# Patient Record
Sex: Female | Born: 1975
Health system: Southern US, Community
[De-identification: ages and names within clinical notes are randomized; demographics above are authoritative.]

## PROBLEM LIST (undated history)

## (undated) ENCOUNTER — Ambulatory Visit: Admission: EM | Source: Home / Self Care

## (undated) DIAGNOSIS — F419 Anxiety disorder, unspecified: Secondary | ICD-10-CM

## (undated) DIAGNOSIS — J45909 Unspecified asthma, uncomplicated: Secondary | ICD-10-CM

## (undated) DIAGNOSIS — D649 Anemia, unspecified: Secondary | ICD-10-CM

## (undated) HISTORY — DX: Anxiety disorder, unspecified: F41.9

## (undated) HISTORY — DX: Anemia, unspecified: D64.9

---

## 1998-08-26 ENCOUNTER — Emergency Department (HOSPITAL_COMMUNITY): Admission: EM | Admit: 1998-08-26 | Discharge: 1998-08-26 | Payer: Self-pay | Admitting: Family Medicine

## 1999-01-15 ENCOUNTER — Emergency Department (HOSPITAL_COMMUNITY): Admission: EM | Admit: 1999-01-15 | Discharge: 1999-01-15 | Payer: Self-pay | Admitting: *Deleted

## 2000-05-04 ENCOUNTER — Other Ambulatory Visit: Admission: RE | Admit: 2000-05-04 | Discharge: 2000-05-04 | Payer: Self-pay | Admitting: Obstetrics and Gynecology

## 2001-04-24 ENCOUNTER — Other Ambulatory Visit: Admission: RE | Admit: 2001-04-24 | Discharge: 2001-04-24 | Payer: Self-pay | Admitting: Obstetrics and Gynecology

## 2001-06-16 ENCOUNTER — Other Ambulatory Visit: Admission: RE | Admit: 2001-06-16 | Discharge: 2001-06-16 | Payer: Self-pay | Admitting: Obstetrics and Gynecology

## 2001-12-20 ENCOUNTER — Other Ambulatory Visit: Admission: RE | Admit: 2001-12-20 | Discharge: 2001-12-20 | Payer: Self-pay | Admitting: Obstetrics and Gynecology

## 2002-04-25 ENCOUNTER — Other Ambulatory Visit: Admission: RE | Admit: 2002-04-25 | Discharge: 2002-04-25 | Payer: Self-pay | Admitting: Obstetrics and Gynecology

## 2002-12-11 ENCOUNTER — Inpatient Hospital Stay (HOSPITAL_COMMUNITY): Admission: AD | Admit: 2002-12-11 | Discharge: 2002-12-14 | Payer: Self-pay | Admitting: Obstetrics and Gynecology

## 2004-07-30 ENCOUNTER — Other Ambulatory Visit: Admission: RE | Admit: 2004-07-30 | Discharge: 2004-07-30 | Payer: Self-pay | Admitting: Obstetrics and Gynecology

## 2004-09-06 ENCOUNTER — Emergency Department: Payer: Self-pay | Admitting: Emergency Medicine

## 2005-11-22 HISTORY — PX: COLONOSCOPY: SHX174

## 2005-11-23 LAB — HM COLONOSCOPY

## 2006-01-07 ENCOUNTER — Other Ambulatory Visit: Admission: RE | Admit: 2006-01-07 | Discharge: 2006-01-07 | Payer: Self-pay | Admitting: Obstetrics and Gynecology

## 2006-08-12 ENCOUNTER — Inpatient Hospital Stay (HOSPITAL_COMMUNITY): Admission: RE | Admit: 2006-08-12 | Discharge: 2006-08-15 | Payer: Self-pay | Admitting: Obstetrics and Gynecology

## 2006-08-30 ENCOUNTER — Ambulatory Visit (HOSPITAL_COMMUNITY): Admission: RE | Admit: 2006-08-30 | Discharge: 2006-08-30 | Payer: Self-pay | Admitting: Obstetrics and Gynecology

## 2007-08-17 ENCOUNTER — Ambulatory Visit: Payer: Self-pay | Admitting: Internal Medicine

## 2009-04-28 ENCOUNTER — Ambulatory Visit: Payer: Self-pay | Admitting: General Practice

## 2009-08-01 ENCOUNTER — Ambulatory Visit: Payer: Self-pay | Admitting: General Practice

## 2010-03-31 ENCOUNTER — Encounter: Payer: Self-pay | Admitting: Physician Assistant

## 2011-06-23 ENCOUNTER — Ambulatory Visit: Payer: Self-pay | Admitting: Family Medicine

## 2011-09-03 ENCOUNTER — Ambulatory Visit: Payer: Self-pay | Admitting: Internal Medicine

## 2011-12-03 ENCOUNTER — Ambulatory Visit: Payer: Self-pay

## 2012-05-05 ENCOUNTER — Other Ambulatory Visit: Payer: Self-pay | Admitting: Obstetrics and Gynecology

## 2012-08-20 ENCOUNTER — Ambulatory Visit: Payer: Self-pay | Admitting: Physician Assistant

## 2013-05-14 ENCOUNTER — Encounter: Payer: Self-pay | Admitting: Obstetrics and Gynecology

## 2013-05-14 ENCOUNTER — Ambulatory Visit (INDEPENDENT_AMBULATORY_CARE_PROVIDER_SITE_OTHER): Payer: 59 | Admitting: Obstetrics and Gynecology

## 2013-05-14 VITALS — BP 126/60 | HR 70 | Ht 67.0 in | Wt 255.0 lb

## 2013-05-14 DIAGNOSIS — Z01419 Encounter for gynecological examination (general) (routine) without abnormal findings: Secondary | ICD-10-CM

## 2013-05-14 DIAGNOSIS — R21 Rash and other nonspecific skin eruption: Secondary | ICD-10-CM

## 2013-05-14 DIAGNOSIS — Z0000 Encounter for general adult medical examination without abnormal findings: Secondary | ICD-10-CM

## 2013-05-14 LAB — POCT URINALYSIS DIPSTICK
Blood, UA: NEGATIVE
Glucose, UA: NEGATIVE
Ketones, UA: NEGATIVE
Protein, UA: NEGATIVE

## 2013-05-14 MED ORDER — TRIAMCINOLONE ACETONIDE 0.025 % EX CREA: TOPICAL_CREAM | Freq: Two times a day (BID) | CUTANEOUS | Status: DC

## 2013-05-14 NOTE — Progress Notes (Signed)
Patient ID: Katherine Adkins, female   DOB: [birth year], 37 y.o.   MRN: [id number] 37 y.o.   Married    [place]   female   [phone]   here for annual exam.   Right buttock itching for 1 year.  There all the time.  No lesions, drainage, or bleeding. No steroid cream use.   No menstrual problems. Trying to loose weight. 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   Some fatigue.  Having TFTs through PCP.  Patient's last menstrual period was 04/26/2013.          Sexually active: yes  The current method of family planning is vasectomy.    Exercising: walking Last mammogram:  never Last pap smear:18 months ago:wnl History of abnormal pap: Yes in 2003:colposcopy with no treatment--paps reverted to normal. Smoking: no Alcohol: 1 glass of wine per month. Last colonoscopy:2010 polyps with physician in [place].  Due for repeat colonoscopy. Last Bone Density:  never Last tetanus shot: unsure, but does through work at the [employer]. Last cholesterol check: 04/2013 Just saw PCP last week.  Hgb: PCP             Urine: neg   Family History  Problem Relation Age of Onset  . Adopted: Yes    There are no active problems to display for this patient.   Past Medical History  Diagnosis Date  . Anemia     Past Surgical History  Procedure Laterality Date  . Cesarean section  2004, 2007    Allergies: Review of patient's allergies indicates no known allergies.  Current Outpatient Prescriptions  Medication Sig Dispense Refill  . VENTOLIN HFA 108 (90 BASE) MCG/ACT inhaler Inhale 1 puff into the lungs as needed.      . zolpidem (AMBIEN) 10 MG tablet Take 1 tablet by mouth as needed.       No current facility-administered medications for this visit.    ROS: Pertinent items are noted in HPI.  Social Hx:  Married.  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  Exam:    BP 126/60  Pulse 70  Ht 5\' 7"  (1.702 m)  Wt 255 lb (115.667 kg)  BMI 39.93 kg/m2  LMP  04/26/2013   Wt Readings from Last 3 Encounters:  05/14/13 255 lb (115.667 kg)     Ht Readings from Last 3 Encounters:  05/14/13 5\' 7"  (1.702 m)    General appearance: alert, cooperative and appears stated age Head: Normocephalic, without obvious abnormality, atraumatic Neck: no adenopathy, supple, symmetrical, trachea midline and thyroid not enlarged, symmetric, no tenderness/mass/nodules Lungs: clear to auscultation bilaterally Breasts: Inspection negative, No nipple retraction or dimpling, No nipple discharge or bleeding, No axillary or supraclavicular adenopathy, Normal to palpation without dominant masses Heart: regular rate and rhythm Abdomen: well healed pfannenstiel incision, soft, non-tender; no masses,  no organomegaly Extremities: extremities normal, atraumatic, no cyanosis or edema Skin: Skin color, texture, turgor normal. No rashes or lesions Lymph nodes: Cervical, supraclavicular, and axillary nodes normal. No abnormal inguinal nodes palpated Neurologic: Grossly normal   Pelvic: External genitalia:  no lesions              Urethra:  normal appearing urethra with no masses, tenderness or lesions              Bartholins and Skenes: normal                 Vagina: normal appearing vagina with normal color and discharge, no  lesions              Cervix: normal appearance              Pap taken: yes and high risk HPV testing.        Bimanual Exam:  Uterus:  uterus is normal size, shape, consistency and nontender                                      Adnexa: normal adnexa in size, nontender and no masses                                      Rectovaginal: Confirms                                      Anus:  normal sphincter tone, right perianal skin with darkening and thickening over a 2 cm area.  No ulcers.    A: normal gynecologic exam Obesity.  Desire for weight loss. Perianal rash.    P: mammogram age 54 years old Encouraged self breast exams pap smear and high risk  HPV testing Discussed weight loss through diet and exercise. Kenalog cream 0.025% to area bid for 2 weeks. Recheck in two weeks.  If no significant improvement, biopsy of perianal skin. return annually or prn     An After Visit Summary was printed and given to the patient.

## 2013-05-14 NOTE — Patient Instructions (Signed)

## 2013-05-16 LAB — IPS PAP TEST WITH HPV

## 2013-05-17 LAB — LIPID PANEL
CHOLESTEROL: 133 mg/dL (ref 0–200)
HDL: 57 mg/dL (ref 35–70)
LDL Cholesterol: 62 mg/dL
Triglycerides: 70 mg/dL (ref 40–160)

## 2013-05-17 LAB — BASIC METABOLIC PANEL
BUN: 11 mg/dL (ref 4–21)
Creatinine: 0.7 mg/dL (ref 0.5–1.1)

## 2013-05-17 LAB — TSH: TSH: 2.8 u[IU]/mL (ref 0.41–5.90)

## 2013-05-17 LAB — CBC AND DIFFERENTIAL: Hemoglobin: 12.9 g/dL (ref 12.0–16.0)

## 2013-06-06 ENCOUNTER — Telehealth: Payer: Self-pay | Admitting: Obstetrics and Gynecology

## 2013-06-06 NOTE — Telephone Encounter (Signed)
Call to patient to schedule 2 week follow up appointment.

## 2013-06-20 ENCOUNTER — Telehealth: Payer: Self-pay | Admitting: Obstetrics and Gynecology

## 2013-06-20 NOTE — Telephone Encounter (Signed)
I have not been able to reach this patient to schedule a two week recheck/follow up. Dr. Edward Jolly wants the patient to have a 2 week follow up from her 6/23 visit to recheck and verify if she needs a vulvar biopsy.

## 2013-06-21 ENCOUNTER — Telehealth: Payer: Self-pay | Admitting: *Deleted

## 2013-06-21 NOTE — Telephone Encounter (Signed)
Call to patient and left message that i am calling to follow up on an important recheck appointment. LMTCB.

## 2013-06-25 ENCOUNTER — Encounter: Payer: Self-pay | Admitting: Obstetrics and Gynecology

## 2013-06-25 NOTE — Telephone Encounter (Signed)
Return call to patient. @ week recheck scheduled for this Friday 06-29-13 with Dr Edward Jolly.

## 2013-06-25 NOTE — Telephone Encounter (Signed)
Patient returning phone call concern F/U visit.

## 2013-06-25 NOTE — Telephone Encounter (Signed)
Patient returned my call and follow up scheduled for 06-29-13.

## 2013-06-29 ENCOUNTER — Ambulatory Visit (INDEPENDENT_AMBULATORY_CARE_PROVIDER_SITE_OTHER): Payer: 59 | Admitting: Obstetrics and Gynecology

## 2013-06-29 ENCOUNTER — Encounter: Payer: Self-pay | Admitting: Obstetrics and Gynecology

## 2013-06-29 VITALS — BP 130/86 | HR 68 | Ht 67.0 in | Wt 252.0 lb

## 2013-06-29 DIAGNOSIS — R21 Rash and other nonspecific skin eruption: Secondary | ICD-10-CM

## 2013-06-29 NOTE — Progress Notes (Signed)
Patient ID: Katherine Adkins, female   DOB: 1975-12-05, 37 y.o.   MRN: [id number]  Subjective  Here for recheck of perianal rash, which is longstanding. Kenalog was used for 2 weeks and symptoms got better. Stopped steroid and symptoms now resumed.  Objective  Right perianal region with 2 cm patch of thickened, dry, dark nonraised, nonscaling skin.  No ulceration.  Procedure  Consent for perianal biopsy. Sterile prep with 1 % lidocaine. 4 mm punch biopsy used. Tissue to pathology. AgNO3 used. Good hemostasis. No complications.  Assessment  Chronic perianal rash/irritation.  Plan  Follow up on biopsy. Precautions/instructions given.

## 2013-06-29 NOTE — Patient Instructions (Signed)
Gently wash with soap and water.  Expect some minor bleeding.  Call if the skin becomes very painful and red.  We will call in about one week with biopsy results.

## 2013-07-03 LAB — IPS CERVICAL/ECC/EMB/VULVAR/VAGINAL BIOPSY

## 2013-07-04 ENCOUNTER — Telehealth: Payer: Self-pay

## 2013-07-04 NOTE — Telephone Encounter (Signed)
LMOVM to call for test results (pathology).

## 2013-07-04 NOTE — Telephone Encounter (Signed)
Message copied by Alphonsa Overall on Wed Jul 04, 2013  2:16 PM ------      Message from: Conley Simmonds      Created: Wed Jul 04, 2013  2:08 PM       Please inform the patient that the biopsy is negative for abnormal cells.            It showed inflammation and pigmentation to the cells.            I encourage her to use the prescription I gave her over a longer period of time to see if we can get relief of her symptoms.            She indicated that it worked as long as she was using it. ------

## 2013-07-05 NOTE — Telephone Encounter (Signed)
Patient notified biopsy benign.

## 2013-09-11 ENCOUNTER — Ambulatory Visit: Payer: Self-pay | Admitting: Emergency Medicine

## 2013-10-08 ENCOUNTER — Ambulatory Visit: Payer: Self-pay

## 2013-12-10 NOTE — Telephone Encounter (Signed)
Had 2 week recheck 06-29-13. Encounter closed.

## 2014-01-23 ENCOUNTER — Encounter: Payer: Self-pay | Admitting: Obstetrics and Gynecology

## 2014-04-18 ENCOUNTER — Encounter: Payer: Self-pay | Admitting: Obstetrics and Gynecology

## 2014-05-16 ENCOUNTER — Ambulatory Visit: Payer: 59 | Admitting: Obstetrics and Gynecology

## 2014-05-17 ENCOUNTER — Ambulatory Visit: Payer: 59 | Admitting: Obstetrics and Gynecology

## 2014-09-23 ENCOUNTER — Encounter: Payer: Self-pay | Admitting: Obstetrics and Gynecology

## 2015-03-13 ENCOUNTER — Ambulatory Visit
Admit: 2015-03-13 | Disposition: A | Discharge status: Home / Self Care | Payer: Self-pay | Attending: Family Medicine | Admitting: Family Medicine

## 2015-03-13 LAB — URINALYSIS, COMPLETE
BILIRUBIN, UR: NEGATIVE
Bacteria: NEGATIVE
Blood: NEGATIVE
GLUCOSE, UR: NEGATIVE
KETONE: NEGATIVE
Leukocyte Esterase: NEGATIVE
Nitrite: NEGATIVE
PROTEIN: NEGATIVE
Ph: 7.5 (ref 5.0–8.0)
SPECIFIC GRAVITY: 1.02 (ref 1.000–1.030)
WBC UR: NONE SEEN /HPF (ref 0–5)

## 2015-03-16 LAB — URINE CULTURE

## 2015-11-05 ENCOUNTER — Encounter: Payer: Self-pay | Admitting: Internal Medicine

## 2015-11-05 DIAGNOSIS — G4700 Insomnia, unspecified: Secondary | ICD-10-CM | POA: Insufficient documentation

## 2015-11-05 DIAGNOSIS — R0683 Snoring: Secondary | ICD-10-CM | POA: Insufficient documentation

## 2015-11-05 DIAGNOSIS — S39012A Strain of muscle, fascia and tendon of lower back, initial encounter: Secondary | ICD-10-CM | POA: Insufficient documentation

## 2015-11-05 DIAGNOSIS — R030 Elevated blood-pressure reading, without diagnosis of hypertension: Secondary | ICD-10-CM | POA: Insufficient documentation

## 2015-11-05 DIAGNOSIS — Z86010 Personal history of colonic polyps: Secondary | ICD-10-CM | POA: Insufficient documentation

## 2016-02-02 ENCOUNTER — Other Ambulatory Visit: Payer: Self-pay | Admitting: Internal Medicine

## 2016-02-04 NOTE — Telephone Encounter (Signed)
Patient set up a physical on June 29 @ 830.

## 2016-05-20 ENCOUNTER — Ambulatory Visit (INDEPENDENT_AMBULATORY_CARE_PROVIDER_SITE_OTHER): Payer: 59 | Admitting: Internal Medicine

## 2016-05-20 ENCOUNTER — Encounter: Payer: Self-pay | Admitting: Internal Medicine

## 2016-05-20 VITALS — BP 120/82 | HR 78 | Resp 16 | Ht 67.0 in | Wt 260.6 lb

## 2016-05-20 DIAGNOSIS — Z86010 Personal history of colonic polyps: Secondary | ICD-10-CM

## 2016-05-20 DIAGNOSIS — Z0000 Encounter for general adult medical examination without abnormal findings: Secondary | ICD-10-CM | POA: Diagnosis not present

## 2016-05-20 DIAGNOSIS — Z1239 Encounter for other screening for malignant neoplasm of breast: Secondary | ICD-10-CM

## 2016-05-20 DIAGNOSIS — J4520 Mild intermittent asthma, uncomplicated: Secondary | ICD-10-CM | POA: Diagnosis not present

## 2016-05-20 DIAGNOSIS — R21 Rash and other nonspecific skin eruption: Secondary | ICD-10-CM

## 2016-05-20 LAB — POCT URINALYSIS DIPSTICK
Bilirubin, UA: NEGATIVE
Glucose, UA: NEGATIVE
Ketones, UA: NEGATIVE
Leukocytes, UA: NEGATIVE
NITRITE UA: NEGATIVE
PH UA: 5
Protein, UA: NEGATIVE
RBC UA: NEGATIVE
SPEC GRAV UA: 1.01
UROBILINOGEN UA: 0.2

## 2016-05-20 MED ORDER — ALBUTEROL SULFATE HFA 108 (90 BASE) MCG/ACT IN AERS: INHALATION_SPRAY | RESPIRATORY_TRACT | Status: DC

## 2016-05-20 MED ORDER — TRIAMCINOLONE ACETONIDE 0.025 % EX CREA: TOPICAL_CREAM | Freq: Two times a day (BID) | CUTANEOUS | Status: DC

## 2016-05-20 NOTE — Patient Instructions (Signed)
Breast Self-Awareness Practicing breast self-awareness may pick up problems early, prevent significant medical complications, and possibly save your life. By practicing breast self-awareness, you can become familiar with how your breasts look and feel and if your breasts are changing. This allows you to notice changes early. It can also offer you some reassurance that your breast health is good. One way to learn what is normal for your breasts and whether your breasts are changing is to do a breast self-exam. If you find a lump or something that was not present in the past, it is best to contact your caregiver right away. Other findings that should be evaluated by your caregiver include nipple discharge, especially if it is bloody; skin changes or reddening; areas where the skin seems to be pulled in (retracted); or new lumps and bumps. Breast pain is seldom associated with cancer (malignancy), but should also be evaluated by a caregiver. HOW TO PERFORM A BREAST SELF-EXAM The best time to examine your breasts is 5-7 days after your menstrual period is over. During menstruation, the breasts are lumpier, and it may be more difficult to pick up changes. If you do not menstruate, have reached menopause, or had your uterus removed (hysterectomy), you should examine your breasts at regular intervals, such as monthly. If you are breastfeeding, examine your breasts after a feeding or after using a breast pump. Breast implants do not decrease the risk for lumps or tumors, so continue to perform breast self-exams as recommended. Talk to your caregiver about how to determine the difference between the implant and breast tissue. Also, talk about the amount of pressure you should use during the exam. Over time, you will become more familiar with the variations of your breasts and more comfortable with the exam. A breast self-exam requires you to remove all your clothes above the waist. 1. Look at your breasts and nipples.  Stand in front of a mirror in a room with good lighting. With your hands on your hips, push your hands firmly downward. Look for a difference in shape, contour, and size from one breast to the other (asymmetry). Asymmetry includes puckers, dips, or bumps. Also, look for skin changes, such as reddened or scaly areas on the breasts. Look for nipple changes, such as discharge, dimpling, repositioning, or redness. 2. Carefully feel your breasts. This is best done either in the shower or tub while using soapy water or when flat on your back. Place the arm (on the side of the breast you are examining) above your head. Use the pads (not the fingertips) of your three middle fingers on your opposite hand to feel your breasts. Start in the underarm area and use  inch (2 cm) overlapping circles to feel your breast. Use 3 different levels of pressure (light, medium, and firm pressure) at each circle before moving to the next circle. The light pressure is needed to feel the tissue closest to the skin. The medium pressure will help to feel breast tissue a little deeper, while the firm pressure is needed to feel the tissue close to the ribs. Continue the overlapping circles, moving downward over the breast until you feel your ribs below your breast. Then, move one finger-width towards the center of the body. Continue to use the  inch (2 cm) overlapping circles to feel your breast as you move slowly up toward the collar bone (clavicle) near the base of the neck. Continue the up and down exam using all 3 pressures until you reach the   middle of the chest. Do this with each breast, carefully feeling for lumps or changes. 3.  Keep a written record with breast changes or normal findings for each breast. By writing this information down, you do not need to depend only on memory for size, tenderness, or location. Write down where you are in your menstrual cycle, if you are still menstruating. Breast tissue can have some lumps or  thick tissue. However, see your caregiver if you find anything that concerns you.  SEEK MEDICAL CARE IF:  You see a change in shape, contour, or size of your breasts or nipples.   You see skin changes, such as reddened or scaly areas on the breasts or nipples.   You have an unusual discharge from your nipples.   You feel a new lump or unusually thick areas.    This information is not intended to replace advice given to you by your health care provider. Make sure you discuss any questions you have with your health care provider.   Document Released: 11/08/2005 Document Revised: 10/25/2012 Document Reviewed: 02/23/2012 Elsevier Interactive Patient Education 2016 Elsevier Inc.  

## 2016-05-20 NOTE — Progress Notes (Signed)
Date:  05/20/2016   Name:  Katherine Adkins   DOB:  1975/12/30   MRN:  [id number]   Chief Complaint: Annual Exam Katherine Adkins is a 40 y.o. female who presents today for her Complete Annual Exam. She feels well. She reports exercising very little. She reports she is sleeping fairly well. She continues to see GYN in [place].  She denies breast problems and is due for a mammogram.  Asthma There is no cough, shortness of breath or wheezing. This is a recurrent problem. The current episode started more than 1 year ago. The problem occurs rarely. Pertinent negatives include no chest pain, fever, headaches or trouble swallowing. Her symptoms are alleviated by beta-agonist. Her past medical history is significant for asthma.   Hx Colon polyp - had colonoscopy done 2007.  She does not have any symptoms such as bleeding or pain, change in bowel habits.   Review of Systems  Constitutional: Negative for fever, chills and fatigue.  HENT: Negative for congestion, hearing loss, tinnitus, trouble swallowing and voice change.   Eyes: Negative for visual disturbance.  Respiratory: Negative for cough, chest tightness, shortness of breath and wheezing.   Cardiovascular: Negative for chest pain, palpitations and leg swelling.  Gastrointestinal: Negative for vomiting, abdominal pain, diarrhea and constipation.  Endocrine: Negative for polydipsia and polyuria.  Genitourinary: Negative for dysuria, frequency, vaginal bleeding, vaginal discharge and genital sores.  Musculoskeletal: Negative for joint swelling, arthralgias and gait problem.  Skin: Negative for color change, rash and wound.       Several skin tags - neck and axilla - that get caught on clothing  Neurological: Negative for dizziness, tremors, light-headedness and headaches.  Hematological: Negative for adenopathy. Does not bruise/bleed easily.  Psychiatric/Behavioral: Negative for sleep disturbance and dysphoric mood. The patient  is not nervous/anxious.     Patient Active Problem List   Diagnosis Date Noted  . Airway hyperreactivity 11/05/2015  . Elevated blood pressure, situational 11/05/2015  . History of colon polyps 11/05/2015  . Cannot sleep 11/05/2015  . Low back strain 11/05/2015  . Snores 11/05/2015    Prior to Admission medications   Medication Sig Start Date End Date Taking? Authorizing Provider  PROAIR HFA 108 [phone] Base) MCG/ACT inhaler TAKE 2 (TWO) PUFFS, INHALATION, FOUR TIMES DAILY AS NEEDED 02/02/16  Yes Glean Hess, MD    No Known Allergies  Past Surgical History  Procedure Laterality Date  . Cesarean section  2004, 2007  . Tubal ligation    . Colonoscopy  2007    [place] - single benign polyp    Social History  Substance Use Topics  . Smoking status: Never Smoker   . Smokeless tobacco: None  . Alcohol Use: No     Comment: 1 glass of wine per month    Medication list has been reviewed and updated.   Physical Exam  Constitutional: She is oriented to person, place, and time. She appears well-developed and well-nourished. No distress.  HENT:  Head: Normocephalic and atraumatic.  Right Ear: Tympanic membrane and ear canal normal.  Left Ear: Tympanic membrane and ear canal normal.  Nose: Right sinus exhibits no maxillary sinus tenderness. Left sinus exhibits no maxillary sinus tenderness.  Mouth/Throat: Uvula is midline and oropharynx is clear and moist.  Eyes: Conjunctivae and EOM are normal. Right eye exhibits no discharge. Left eye exhibits no discharge. No scleral icterus.  Neck: Normal range of motion. Carotid bruit is not present. No erythema present. No thyromegaly  present.  Cardiovascular: Normal rate, regular rhythm, normal heart sounds and normal pulses.   Pulmonary/Chest: Effort normal. No respiratory distress. She has no wheezes. Right breast exhibits no mass, no nipple discharge, no skin change and no tenderness. Left breast exhibits no mass, no nipple discharge,  no skin change and no tenderness.  Abdominal: Soft. Bowel sounds are normal. There is no hepatosplenomegaly. There is no tenderness. There is no CVA tenderness.  Musculoskeletal: Normal range of motion.  Lymphadenopathy:    She has no cervical adenopathy.    She has no axillary adenopathy.  Neurological: She is alert and oriented to person, place, and time. She has normal reflexes. No cranial nerve deficit or sensory deficit.  Skin: Skin is warm, dry and intact. No rash noted.     Psychiatric: She has a normal mood and affect. Her speech is normal and behavior is normal. Thought content normal.  Nursing note and vitals reviewed.   BP 120/82 mmHg  Pulse 78  Resp 16  Ht 5\' 7"  (1.702 m)  Wt 260 lb 9.6 oz (118.207 kg)  BMI 40.81 kg/m2  SpO2 98%  LMP 04/22/2016 (Approximate)  Assessment and Plan: 1. Annual physical exam Recommend regular aerobic exercise 4 times per week - CBC with Differential/Platelet - Comprehensive metabolic panel - Lipid panel - TSH - POCT urinalysis dipstick  2. Breast cancer screening - MM DIGITAL SCREENING BILATERAL; Future  3. Airway hyperreactivity, mild intermittent, uncomplicated - albuterol (PROAIR HFA) 108 (90 Base) MCG/ACT inhaler; TAKE 2 (TWO) PUFFS, INHALATION, FOUR TIMES DAILY AS NEEDED  Dispense: 8.5 Inhaler; Refill: 0  4. History of colon polyps - Ambulatory referral to Gastroenterology  5. Rash and nonspecific skin eruption - triamcinolone (KENALOG) 0.025 % cream; Apply topically 2 (two) times daily.  Dispense: 30 g; Refill: 0   Halina Maidens, MD [facility]  05/20/2016

## 2016-05-21 LAB — CBC WITH DIFFERENTIAL/PLATELET
BASOS ABS: 0 10*3/uL (ref 0.0–0.2)
Basos: 1 %
EOS (ABSOLUTE): 0.1 10*3/uL (ref 0.0–0.4)
Eos: 3 %
HEMOGLOBIN: 12.8 g/dL (ref 11.1–15.9)
Hematocrit: 40.2 % (ref 34.0–46.6)
IMMATURE GRANS (ABS): 0 10*3/uL (ref 0.0–0.1)
IMMATURE GRANULOCYTES: 0 %
LYMPHS: 35 %
Lymphocytes Absolute: 1.6 10*3/uL (ref 0.7–3.1)
MCH: 28.1 pg (ref 26.6–33.0)
MCHC: 31.8 g/dL (ref 31.5–35.7)
MCV: 88 fL (ref 79–97)
MONOCYTES: 15 %
Monocytes Absolute: 0.7 10*3/uL (ref 0.1–0.9)
NEUTROS ABS: 2.1 10*3/uL (ref 1.4–7.0)
NEUTROS PCT: 46 %
Platelets: 308 10*3/uL (ref 150–379)
RBC: 4.55 x10E6/uL (ref 3.77–5.28)
RDW: 13.6 % (ref 12.3–15.4)
WBC: 4.6 10*3/uL (ref 3.4–10.8)

## 2016-05-21 LAB — LIPID PANEL
CHOL/HDL RATIO: 2.2 ratio (ref 0.0–4.4)
Cholesterol, Total: 119 mg/dL (ref 100–199)
HDL: 55 mg/dL (ref >39)
LDL CALC: 48 mg/dL (ref 0–99)
Triglycerides: 82 mg/dL (ref 0–149)
VLDL CHOLESTEROL CAL: 16 mg/dL (ref 5–40)

## 2016-05-21 LAB — COMPREHENSIVE METABOLIC PANEL
ALBUMIN: 4 g/dL (ref 3.5–5.5)
ALT: 29 IU/L (ref 0–32)
AST: 22 IU/L (ref 0–40)
Albumin/Globulin Ratio: 1.2 (ref 1.2–2.2)
Alkaline Phosphatase: 76 IU/L (ref 39–117)
BILIRUBIN TOTAL: 0.5 mg/dL (ref 0.0–1.2)
BUN / CREAT RATIO: 19 (ref 9–23)
BUN: 14 mg/dL (ref 6–24)
CALCIUM: 9 mg/dL (ref 8.7–10.2)
CO2: 22 mmol/L (ref 18–29)
CREATININE: 0.72 mg/dL (ref 0.57–1.00)
Chloride: 100 mmol/L (ref 96–106)
GFR, EST AFRICAN AMERICAN: 120 mL/min/{1.73_m2} (ref >59)
GFR, EST NON AFRICAN AMERICAN: 104 mL/min/{1.73_m2} (ref >59)
GLUCOSE: 84 mg/dL (ref 65–99)
Globulin, Total: 3.4 g/dL (ref 1.5–4.5)
Potassium: 4.4 mmol/L (ref 3.5–5.2)
Sodium: 138 mmol/L (ref 134–144)
TOTAL PROTEIN: 7.4 g/dL (ref 6.0–8.5)

## 2016-05-21 LAB — TSH: TSH: 2.7 u[IU]/mL (ref 0.450–4.500)

## 2016-05-24 ENCOUNTER — Other Ambulatory Visit: Payer: Self-pay

## 2016-05-24 ENCOUNTER — Telehealth: Payer: Self-pay

## 2016-05-24 MED ORDER — NA SULFATE-K SULFATE-MG SULF 17.5-3.13-1.6 GM/177ML PO SOLN: 1.0000 | ORAL | Status: DC

## 2016-05-24 NOTE — Telephone Encounter (Signed)
Gastroenterology Pre-Procedure Review  Request Date: 05/31/16 Requesting Physician: Dr. Army Melia  PATIENT REVIEW QUESTIONS: The patient responded to the following health history questions as indicated:    1. Are you having any GI issues? no 2. Do you have a personal history of Polyps? no 3. Do you have a family history of Colon Cancer or Polyps? no 4. Diabetes Mellitus? no 5. Joint replacements in the past 12 months?no 6. Major health problems in the past 3 months?no 7. Any artificial heart valves, MVP, or defibrillator?no    MEDICATIONS & ALLERGIES:    Patient reports the following regarding taking any anticoagulation/antiplatelet therapy:   Plavix, Coumadin, Eliquis, Xarelto, Lovenox, Pradaxa, Brilinta, or Effient? no Aspirin? no  Patient confirms/reports the following medications:  Current Outpatient Prescriptions  Medication Sig Dispense Refill  . albuterol (PROAIR HFA) 108 (90 Base) MCG/ACT inhaler TAKE 2 (TWO) PUFFS, INHALATION, FOUR TIMES DAILY AS NEEDED 8.5 Inhaler 0  . Multiple Vitamins-Minerals (MULTIVITAMIN WITH MINERALS) tablet Take 1 tablet by mouth daily.    Marland Kitchen triamcinolone (KENALOG) 0.025 % cream Apply topically 2 (two) times daily. 30 g 0   No current facility-administered medications for this visit.    Patient confirms/reports the following allergies:  No Known Allergies  No orders of the defined types were placed in this encounter.    AUTHORIZATION INFORMATION Primary Insurance: 1D#: Group #:  Secondary Insurance: 1D#: Group #:  SCHEDULE INFORMATION: Date: 05/31/16 Time: Location: [place]

## 2016-05-26 ENCOUNTER — Encounter: Payer: Self-pay | Admitting: *Deleted

## 2016-05-28 NOTE — Discharge Instructions (Signed)

## 2016-05-31 ENCOUNTER — Encounter
Admission: RE | Disposition: A | Discharge status: Home / Self Care | Payer: Self-pay | Source: Ambulatory Visit | Attending: Gastroenterology

## 2016-05-31 ENCOUNTER — Ambulatory Visit: Payer: 59 | Admitting: Anesthesiology

## 2016-05-31 ENCOUNTER — Ambulatory Visit
Admission: RE | Admit: 2016-05-31 | Discharge: 2016-05-31 | Disposition: A | Discharge status: Home / Self Care | Payer: 59 | Source: Ambulatory Visit | Attending: Gastroenterology | Admitting: Gastroenterology

## 2016-05-31 DIAGNOSIS — Z122 Encounter for screening for malignant neoplasm of respiratory organs: Secondary | ICD-10-CM | POA: Diagnosis not present

## 2016-05-31 DIAGNOSIS — Z79899 Other long term (current) drug therapy: Secondary | ICD-10-CM | POA: Insufficient documentation

## 2016-05-31 DIAGNOSIS — Z860100 Personal history of colon polyps, unspecified: Secondary | ICD-10-CM | POA: Insufficient documentation

## 2016-05-31 DIAGNOSIS — Z1211 Encounter for screening for malignant neoplasm of colon: Secondary | ICD-10-CM | POA: Insufficient documentation

## 2016-05-31 DIAGNOSIS — D124 Benign neoplasm of descending colon: Secondary | ICD-10-CM | POA: Insufficient documentation

## 2016-05-31 DIAGNOSIS — J45909 Unspecified asthma, uncomplicated: Secondary | ICD-10-CM | POA: Diagnosis not present

## 2016-05-31 DIAGNOSIS — G709 Myoneural disorder, unspecified: Secondary | ICD-10-CM | POA: Insufficient documentation

## 2016-05-31 DIAGNOSIS — F419 Anxiety disorder, unspecified: Secondary | ICD-10-CM | POA: Insufficient documentation

## 2016-05-31 DIAGNOSIS — Z862 Personal history of diseases of the blood and blood-forming organs and certain disorders involving the immune mechanism: Secondary | ICD-10-CM | POA: Diagnosis not present

## 2016-05-31 DIAGNOSIS — I10 Essential (primary) hypertension: Secondary | ICD-10-CM | POA: Insufficient documentation

## 2016-05-31 HISTORY — PX: COLONOSCOPY WITH PROPOFOL: SHX5780

## 2016-05-31 HISTORY — DX: Unspecified asthma, uncomplicated: J45.909

## 2016-05-31 HISTORY — PX: POLYPECTOMY: SHX5525

## 2016-05-31 SURGERY — COLONOSCOPY WITH PROPOFOL
Anesthesia: Monitor Anesthesia Care | Wound class: Contaminated

## 2016-05-31 MED ORDER — LACTATED RINGERS IV SOLN
INTRAVENOUS | Status: DC
  Administered 2016-05-31: 09:00:00 via INTRAVENOUS

## 2016-05-31 MED ORDER — STERILE WATER FOR IRRIGATION IR SOLN
Status: DC | PRN
  Administered 2016-05-31: 09:00:00

## 2016-05-31 MED ORDER — ACETAMINOPHEN 160 MG/5ML PO SOLN: 325.0000 mg | ORAL | Status: DC | PRN

## 2016-05-31 MED ORDER — ACETAMINOPHEN 325 MG PO TABS: 325.0000 mg | ORAL_TABLET | ORAL | Status: DC | PRN

## 2016-05-31 MED ORDER — PROPOFOL 10 MG/ML IV BOLUS
INTRAVENOUS | Status: DC | PRN
  Administered 2016-05-31 (×2): 30 mg via INTRAVENOUS
  Administered 2016-05-31: 140 mg via INTRAVENOUS

## 2016-05-31 MED ORDER — ONDANSETRON HCL 4 MG/2ML IJ SOLN: 4.0000 mg | Freq: Once | INTRAMUSCULAR | Status: DC | PRN

## 2016-05-31 MED ORDER — LIDOCAINE HCL (CARDIAC) 20 MG/ML IV SOLN
INTRAVENOUS | Status: DC | PRN
  Administered 2016-05-31: 50 mg via INTRAVENOUS

## 2016-05-31 SURGICAL SUPPLY — 23 items
CANISTER SUCT 1200ML W/VALVE (MISCELLANEOUS) ×3 IMPLANT
CLIP HMST 235XBRD CATH ROT (MISCELLANEOUS) IMPLANT
CLIP RESOLUTION 360 11X235 (MISCELLANEOUS)
FCP ESCP3.2XJMB 240X2.8X (MISCELLANEOUS)
FORCEPS BIOP RAD 4 LRG CAP 4 (CUTTING FORCEPS) ×1 IMPLANT
FORCEPS BIOP RJ4 240 W/NDL (MISCELLANEOUS)
FORCEPS ESCP3.2XJMB 240X2.8X (MISCELLANEOUS) IMPLANT
GOWN CVR UNV OPN BCK APRN NK (MISCELLANEOUS) ×4 IMPLANT
GOWN ISOL THUMB LOOP REG UNIV (MISCELLANEOUS) ×6
INJECTOR VARIJECT VIN23 (MISCELLANEOUS) IMPLANT
KIT DEFENDO VALVE AND CONN (KITS) IMPLANT
KIT ENDO PROCEDURE OLY (KITS) ×3 IMPLANT
MARKER SPOT ENDO TATTOO 5ML (MISCELLANEOUS) IMPLANT
PAD GROUND ADULT SPLIT (MISCELLANEOUS) IMPLANT
PROBE APC STR FIRE (PROBE) IMPLANT
RETRIEVER NET ROTH 2.5X230 LF (MISCELLANEOUS) ×3 IMPLANT
SNARE SHORT THROW 13M SML OVAL (MISCELLANEOUS) IMPLANT
SNARE SHORT THROW 30M LRG OVAL (MISCELLANEOUS) IMPLANT
SNARE SNG USE RND 15MM (INSTRUMENTS) IMPLANT
SPOT EX ENDOSCOPIC TATTOO (MISCELLANEOUS)
TRAP ETRAP POLY (MISCELLANEOUS) IMPLANT
VARIJECT INJECTOR VIN23 (MISCELLANEOUS)
WATER STERILE IRR 250ML POUR (IV SOLUTION) ×3 IMPLANT

## 2016-05-31 NOTE — Op Note (Signed)
[facility] Gastroenterology Patient Name: Katherine Adkins Procedure Date: 05/31/2016 9:07 AM MRN: [id number] Account #: [id number] Date of Birth: 05/28/1976 Admit Type: Outpatient Age: 40 Room: [facility] OR ROOM 01 Gender: Female Note Status: Finalized Procedure:            Colonoscopy Indications:          High risk colon cancer surveillance: Personal history                        of colonic polyps Providers:            Lucilla Lame, MD Referring MD:         Halina Maidens, MD (Referring MD) Medicines:            Propofol per Anesthesia Complications:        No immediate complications. Procedure:            Pre-Anesthesia Assessment:                       - Prior to the procedure, a History and Physical was                        performed, and patient medications and allergies were                        reviewed. The patient's tolerance of previous                        anesthesia was also reviewed. The risks and benefits of                        the procedure and the sedation options and risks were                        discussed with the patient. All questions were                        answered, and informed consent was obtained. Prior                        Anticoagulants: The patient has taken no previous                        anticoagulant or antiplatelet agents. ASA Grade                        Assessment: II - A patient with mild systemic disease.                        After reviewing the risks and benefits, the patient was                        deemed in satisfactory condition to undergo the                        procedure.                       After obtaining informed consent, the colonoscope was  passed under direct vision. Throughout the procedure,                        the patient's blood pressure, pulse, and oxygen                        saturations were monitored continuously. The Olympus   CF-HQ190L Colonoscope (S#. [id number]) was introduced                        through the anus and advanced to the the cecum,                        identified by appendiceal orifice and ileocecal valve.                        The colonoscopy was performed without difficulty. The                        patient tolerated the procedure well. The quality of                        the bowel preparation was excellent. Findings:      The perianal and digital rectal examinations were normal.      A 3 mm polyp was found in the descending colon. The polyp was sessile.       The polyp was removed with a cold biopsy forceps. Resection and       retrieval were complete. Impression:           - One 3 mm polyp in the descending colon, removed with                        a cold biopsy forceps. Resected and retrieved. Recommendation:       - Await pathology results.                       - Repeat colonoscopy in 5 years for surveillance. Procedure Code(s):    --- Professional ---                       [phone], Colonoscopy, flexible; with biopsy, single or                        multiple Diagnosis Code(s):    --- Professional ---                       Z86.010, Personal history of colonic polyps                       D12.4, Benign neoplasm of descending colon CPT copyright 2016 American Medical Association. All rights reserved. The codes documented in this report are preliminary and upon coder review may  be revised to meet current compliance requirements. Lucilla Lame, MD 05/31/2016 9:33:01 AM This report has been signed electronically. Number of Addenda: 0 Note Initiated On: 05/31/2016 9:07 AM Scope Withdrawal Time: 0 hours 6 minutes 13 seconds  Total Procedure Duration: 0 hours 8 minutes 53 seconds       [facility]

## 2016-05-31 NOTE — Anesthesia Postprocedure Evaluation (Signed)
Anesthesia Post Note  Patient: Katherine Adkins  Procedure(s) Performed: Procedure(s) (LRB): COLONOSCOPY WITH PROPOFOL (N/A) POLYPECTOMY  Patient location during evaluation: PACU Anesthesia Type: MAC Level of consciousness: awake and alert Pain management: pain level controlled Vital Signs Assessment: post-procedure vital signs reviewed and stable Respiratory status: spontaneous breathing, nonlabored ventilation, respiratory function stable and patient connected to nasal cannula oxygen Cardiovascular status: stable and blood pressure returned to baseline Anesthetic complications: no    Amaryllis Dyke

## 2016-05-31 NOTE — Anesthesia Preprocedure Evaluation (Signed)
Anesthesia Evaluation  Patient identified by MRN, date of birth, ID band Patient awake    Reviewed: Allergy & Precautions, H&P , NPO status   Airway Mallampati: II  TM Distance: >3 FB Neck ROM: full    Dental   Pulmonary asthma ,    breath sounds clear to auscultation       Cardiovascular hypertension,  Rhythm:regular Rate:Normal     Neuro/Psych  Neuromuscular disease    GI/Hepatic   Endo/Other    Renal/GU      Musculoskeletal   Abdominal   Peds  Hematology  (+) anemia ,   Anesthesia Other Findings   Reproductive/Obstetrics                             Anesthesia Physical Anesthesia Plan  ASA: III  Anesthesia Plan: MAC   Post-op Pain Management:    Induction:   Airway Management Planned:   Additional Equipment:   Intra-op Plan:   Post-operative Plan:   Informed Consent: I have reviewed the patients History and Physical, chart, labs and discussed the procedure including the risks, benefits and alternatives for the proposed anesthesia with the patient or authorized representative who has indicated his/her understanding and acceptance.     Plan Discussed with: CRNA  Anesthesia Plan Comments:         Anesthesia Quick Evaluation

## 2016-05-31 NOTE — Transfer of Care (Signed)
Immediate Anesthesia Transfer of Care Note  Patient: Katherine Adkins  Procedure(s) Performed: Procedure(s): COLONOSCOPY WITH PROPOFOL (N/A) POLYPECTOMY  Patient Location: PACU  Anesthesia Type: MAC  Level of Consciousness: awake, alert  and patient cooperative  Airway and Oxygen Therapy: Patient Spontanous Breathing and Patient connected to supplemental oxygen  Post-op Assessment: Post-op Vital signs reviewed, Patient's Cardiovascular Status Stable, Respiratory Function Stable, Patent Airway and No signs of Nausea or vomiting  Post-op Vital Signs: Reviewed and stable  Complications: No apparent anesthesia complications

## 2016-05-31 NOTE — Anesthesia Procedure Notes (Signed)
Procedure Name: MAC Date/Time: 05/31/2016 9:17 AM Performed by: Cameron Ali Pre-anesthesia Checklist: Patient identified, Emergency Drugs available, Suction available, Timeout performed and Patient being monitored Patient Re-evaluated:Patient Re-evaluated prior to inductionOxygen Delivery Method: Nasal cannula Placement Confirmation: positive ETCO2

## 2016-05-31 NOTE — H&P (Signed)
  Lucilla Lame, MD [address] [place], [place] [address] Phone: [phone] Fax : [phone]  Primary Care Physician:  Halina Maidens, MD Primary Gastroenterologist:  Dr. Allen Norris  Pre-Procedure History & Physical: HPI:  Katherine Adkins is a 40 y.o. female is here for an colonoscopy.   Past Medical History  Diagnosis Date  . Anemia   . Anxiety   . Asthma     Past Surgical History  Procedure Laterality Date  . Cesarean section  2004, 2007  . Colonoscopy  2007    [facility] - single benign polyp    Prior to Admission medications   Medication Sig Start Date End Date Taking? Authorizing Provider  albuterol (PROAIR HFA) 108 (90 Base) MCG/ACT inhaler TAKE 2 (TWO) PUFFS, INHALATION, FOUR TIMES DAILY AS NEEDED 05/20/16  Yes Glean Hess, MD  Multiple Vitamins-Minerals (MULTIVITAMIN WITH MINERALS) tablet Take 1 tablet by mouth daily.   Yes Historical Provider, MD  Na Sulfate-K Sulfate-Mg Sulf (SUPREP BOWEL PREP KIT) 17.5-3.13-1.6 GM/180ML SOLN Take 1 kit by mouth as directed. 05/24/16  Yes Lucilla Lame, MD  triamcinolone (KENALOG) 0.025 % cream Apply topically 2 (two) times daily. 05/20/16   Glean Hess, MD    Allergies as of 05/24/2016  . (No Known Allergies)    Family History  Problem Relation Age of Onset  . Adopted: Yes    Social History   Social History  . Marital Status: Married    Spouse Name: N/A  . Number of Children: N/A  . Years of Education: N/A   Occupational History  . Not on file.   Social History Main Topics  . Smoking status: Never Smoker   . Smokeless tobacco: Not on file  . Alcohol Use: No     Comment: 1 glass of wine per month  . Drug Use: No  . Sexual Activity: Yes    Birth Control/ Protection: Other-see comments     Comment: vasectomy   Other Topics Concern  . Not on file   Social History Narrative    Review of Systems: See HPI, otherwise negative ROS  Physical Exam: BP 143/73 mmHg  Pulse 83  Temp(Src)  97.7 F (36.5 C)  Resp 16  Ht 5' 7" (1.702 m)  Wt 259 lb (117.482 kg)  BMI 40.56 kg/m2  SpO2 98%  LMP 04/22/2016 (Approximate) General:   Alert,  pleasant and cooperative in NAD Head:  Normocephalic and atraumatic. Neck:  Supple; no masses or thyromegaly. Lungs:  Clear throughout to auscultation.    Heart:  Regular rate and rhythm. Abdomen:  Soft, nontender and nondistended. Normal bowel sounds, without guarding, and without rebound.   Neurologic:  Alert and  oriented x4;  grossly normal neurologically.  Impression/Plan: Katherine Adkins is here for an colonoscopy to be performed for history of colon polyps  Risks, benefits, limitations, and alternatives regarding  colonoscopy have been reviewed with the patient.  Questions have been answered.  All parties agreeable.   Lucilla Lame, MD  05/31/2016, 8:43 AM

## 2016-06-01 ENCOUNTER — Encounter: Payer: Self-pay | Admitting: Gastroenterology

## 2016-06-03 ENCOUNTER — Encounter: Payer: Self-pay | Admitting: Gastroenterology

## 2017-04-15 ENCOUNTER — Other Ambulatory Visit: Payer: Self-pay | Admitting: Internal Medicine

## 2017-04-15 DIAGNOSIS — J4520 Mild intermittent asthma, uncomplicated: Secondary | ICD-10-CM

## 2017-05-30 ENCOUNTER — Encounter: Payer: 59 | Admitting: Internal Medicine

## 2017-07-25 ENCOUNTER — Other Ambulatory Visit: Payer: Self-pay | Admitting: Internal Medicine

## 2017-12-02 DIAGNOSIS — J45991 Cough variant asthma: Secondary | ICD-10-CM | POA: Diagnosis not present

## 2017-12-02 DIAGNOSIS — J069 Acute upper respiratory infection, unspecified: Secondary | ICD-10-CM | POA: Diagnosis not present

## 2017-12-02 DIAGNOSIS — R0982 Postnasal drip: Secondary | ICD-10-CM | POA: Diagnosis not present

## 2018-02-09 DIAGNOSIS — J209 Acute bronchitis, unspecified: Secondary | ICD-10-CM | POA: Diagnosis not present

## 2018-02-27 DIAGNOSIS — L237 Allergic contact dermatitis due to plants, except food: Secondary | ICD-10-CM | POA: Diagnosis not present

## 2018-09-05 DIAGNOSIS — Z124 Encounter for screening for malignant neoplasm of cervix: Secondary | ICD-10-CM | POA: Diagnosis not present

## 2018-09-05 DIAGNOSIS — Z01419 Encounter for gynecological examination (general) (routine) without abnormal findings: Secondary | ICD-10-CM | POA: Diagnosis not present

## 2018-09-05 DIAGNOSIS — Z1239 Encounter for other screening for malignant neoplasm of breast: Secondary | ICD-10-CM | POA: Diagnosis not present

## 2018-09-05 LAB — HM PAP SMEAR: HM Pap smear: NEGATIVE

## 2018-09-05 LAB — RESULTS CONSOLE HPV: CHL HPV: NEGATIVE

## 2018-10-06 DIAGNOSIS — Z1239 Encounter for other screening for malignant neoplasm of breast: Secondary | ICD-10-CM | POA: Diagnosis not present

## 2018-10-06 DIAGNOSIS — Z1231 Encounter for screening mammogram for malignant neoplasm of breast: Secondary | ICD-10-CM | POA: Diagnosis not present

## 2018-10-06 DIAGNOSIS — N6489 Other specified disorders of breast: Secondary | ICD-10-CM | POA: Diagnosis not present

## 2018-10-20 DIAGNOSIS — J069 Acute upper respiratory infection, unspecified: Secondary | ICD-10-CM | POA: Diagnosis not present

## 2018-10-24 DIAGNOSIS — J9801 Acute bronchospasm: Secondary | ICD-10-CM | POA: Diagnosis not present

## 2018-10-24 DIAGNOSIS — J4521 Mild intermittent asthma with (acute) exacerbation: Secondary | ICD-10-CM | POA: Diagnosis not present

## 2018-10-27 DIAGNOSIS — R928 Other abnormal and inconclusive findings on diagnostic imaging of breast: Secondary | ICD-10-CM | POA: Diagnosis not present

## 2018-10-27 DIAGNOSIS — N6489 Other specified disorders of breast: Secondary | ICD-10-CM | POA: Diagnosis not present

## 2018-10-31 ENCOUNTER — Encounter: Payer: Self-pay | Admitting: Internal Medicine

## 2018-10-31 ENCOUNTER — Ambulatory Visit (INDEPENDENT_AMBULATORY_CARE_PROVIDER_SITE_OTHER): Payer: 59 | Admitting: Internal Medicine

## 2018-10-31 VITALS — BP 122/78 | HR 92 | Temp 98.9°F | Ht 67.0 in | Wt 259.0 lb

## 2018-10-31 DIAGNOSIS — J4520 Mild intermittent asthma, uncomplicated: Secondary | ICD-10-CM | POA: Insufficient documentation

## 2018-10-31 DIAGNOSIS — J40 Bronchitis, not specified as acute or chronic: Secondary | ICD-10-CM

## 2018-10-31 DIAGNOSIS — J4541 Moderate persistent asthma with (acute) exacerbation: Secondary | ICD-10-CM | POA: Diagnosis not present

## 2018-10-31 MED ORDER — PREDNISONE 10 MG PO TABS: ORAL_TABLET | ORAL | 0 refills | Status: AC

## 2018-10-31 MED ORDER — AMOXICILLIN-POT CLAVULANATE 875-125 MG PO TABS: 1.0000 | ORAL_TABLET | Freq: Two times a day (BID) | ORAL | 0 refills | Status: AC

## 2018-10-31 MED ORDER — ALBUTEROL SULFATE HFA 108 (90 BASE) MCG/ACT IN AERS: 2.0000 | INHALATION_SPRAY | RESPIRATORY_TRACT | 2 refills | Status: DC | PRN

## 2018-10-31 NOTE — Progress Notes (Signed)
Date:  10/31/2018   Name:  Katherine Adkins   DOB:  22-Feb-1976   MRN:  [id number]   Chief Complaint: Cough (Started 2 weeks ago. Green production. Started with scratchy throat, body aches. Teledoctor prescribed prednisone. Did not help. Seen UC a week ago. They have her nebulizer but no abx. Tried mucinex, robitussin, etc. Coughing is worse than its ever been. Hard and thick mucous. No blood. Refill on proair inhaler and wants ABX.  )  Cough  This is a chronic problem. The current episode started 1 to 4 weeks ago. The problem has been gradually worsening. The problem occurs every few minutes. The cough is productive of sputum. Associated symptoms include postnasal drip and wheezing. Pertinent negatives include no chest pain, chills, fever or headaches. She has tried a beta-agonist inhaler and OTC cough suppressant (prednisone) for the symptoms. The treatment provided no relief. Her past medical history is significant for asthma.  Asthma  She complains of cough and wheezing. This is a chronic problem. The problem has been gradually worsening. Associated symptoms include postnasal drip and trouble swallowing. Pertinent negatives include no chest pain, fever or headaches. Her past medical history is significant for asthma.  Prior to this illness, she used albuterol MDI about 2 times per month per exercise or with allergy sx.  Review of Systems  Constitutional: Positive for fatigue. Negative for chills, fever and unexpected weight change.  HENT: Positive for postnasal drip, sinus pressure and trouble swallowing.   Eyes: Negative for visual disturbance.  Respiratory: Positive for cough and wheezing.   Cardiovascular: Negative for chest pain, palpitations and leg swelling.  Musculoskeletal: Negative for arthralgias.  Neurological: Negative for dizziness, weakness and headaches.  Psychiatric/Behavioral: Negative for dysphoric mood and sleep disturbance.    Patient Active Problem List   Diagnosis Date Noted  . Personal history of colonic polyps   . Benign neoplasm of descending colon   . Airway hyperreactivity 11/05/2015  . Elevated blood pressure, situational 11/05/2015  . History of colon polyps 11/05/2015  . Cannot sleep 11/05/2015  . Low back strain 11/05/2015  . Snores 11/05/2015    No Known Allergies  Past Surgical History:  Procedure Laterality Date  . CESAREAN SECTION  2004, 2007  . COLONOSCOPY  2007   [facility] - single benign polyp  . COLONOSCOPY WITH PROPOFOL N/A 05/31/2016   Procedure: COLONOSCOPY WITH PROPOFOL;  Surgeon: Lucilla Lame, MD;  Location: [facility];  Service: Endoscopy;  Laterality: N/A;  . POLYPECTOMY  05/31/2016   Procedure: POLYPECTOMY;  Surgeon: Lucilla Lame, MD;  Location: [facility];  Service: Endoscopy;;    Social History   Tobacco Use  . Smoking status: Never Smoker  Substance Use Topics  . Alcohol use: No    Alcohol/week: 0.0 standard drinks    Comment: 1 glass of wine per month  . Drug use: No     Medication list has been reviewed and updated.  Current Meds  Medication Sig  . Multiple Vitamins-Minerals (MULTIVITAMIN WITH MINERALS) tablet Take 1 tablet by mouth daily.  Marland Kitchen PROAIR HFA 108 (90 Base) MCG/ACT inhaler INHALE TWO PUFFS BY MOUTH 4 TIMES DAILY AS NEEDED  . triamcinolone (KENALOG) 0.025 % cream Apply topically 2 (two) times daily.    PHQ 2/9 Scores 05/20/2016  PHQ - 2 Score 0    Physical Exam  Constitutional: She is oriented to person, place, and time. She appears well-developed. No distress.  HENT:  Head: Normocephalic and atraumatic.  Neck: Normal  range of motion. Neck supple.  Cardiovascular: Normal rate, regular rhythm and normal heart sounds.  Pulmonary/Chest: Effort normal. No accessory muscle usage. No respiratory distress. She has wheezes in the right upper field. She has no rhonchi. She has no rales.  Musculoskeletal: Normal range of motion.  Lymphadenopathy:    She has no  cervical adenopathy.  Neurological: She is alert and oriented to person, place, and time.  Skin: Skin is warm and dry. No rash noted.  Psychiatric: She has a normal mood and affect. Her speech is normal and behavior is normal. Thought content normal.  Nursing note and vitals reviewed.   BP 122/78 (BP Location: Right Arm, Patient Position: Sitting, Cuff Size: Large)   Pulse 92   Temp 98.9 F (37.2 C) (Oral)   Ht 5\' 7"  (1.702 m)   Wt 259 lb (117.5 kg)   SpO2 99%   BMI 40.57 kg/m   Assessment and Plan: 1. Moderate persistent asthma with acute exacerbation in adult Symbicort 160/4.5 - one puff bid (sample) call for Rx if helpful - predniSONE (DELTASONE) 10 MG tablet; Take 6 on day 1and 2, 5 on day 3 and 4, 4 on day 5 and 6 , 3 on day 7 and 8, 2 on day 9 and 10 and 1 on day 11 and 12 then stop.  Dispense: 42 tablet; Refill: 0 - albuterol (PROAIR HFA) 108 (90 Base) MCG/ACT inhaler; Inhale 2 puffs into the lungs every 4 (four) hours as needed for wheezing or shortness of breath.  Dispense: 18 each; Refill: 2  2. Bronchitis - amoxicillin-clavulanate (AUGMENTIN) 875-125 MG tablet; Take 1 tablet by mouth 2 (two) times daily for 10 days.  Dispense: 20 tablet; Refill: 0   Partially dictated using Editor, commissioning. Any errors are unintentional.  Halina Maidens, MD [facility]  10/31/2018

## 2018-11-08 ENCOUNTER — Telehealth: Payer: Self-pay

## 2018-11-08 NOTE — Telephone Encounter (Signed)
Called and LVM informing of Dr B instructions. Told to call back if any further questions or concerns.

## 2018-11-08 NOTE — Telephone Encounter (Signed)
Patient called leaving VM stating she was seen in our office at the beginning of last week. She is feeling better congestion wise but she is still having severe coughing fits to the point its keeping her up at night and she is still having thick yellow mucous. She is taking some old tessalon pearls that she had from a past infection. They are not helping. She wanted to know if there is anything else she can have to help with cough.  Please Advise.

## 2018-11-08 NOTE — Telephone Encounter (Signed)
She still has a day or two of antibiotics and she will have a cough for several more weeks.  Use the Albuterol inhaler every 4 hours if needed as the cough may be partly from bronchospasm.  Also, Delsym is a good over the counter cough syrup.

## 2018-12-20 ENCOUNTER — Other Ambulatory Visit: Payer: Self-pay

## 2018-12-20 ENCOUNTER — Ambulatory Visit: Payer: 59 | Admitting: Internal Medicine

## 2018-12-20 ENCOUNTER — Encounter: Payer: Self-pay | Admitting: Internal Medicine

## 2018-12-20 VITALS — BP 124/80 | HR 86 | Ht 67.0 in | Wt 266.6 lb

## 2018-12-20 DIAGNOSIS — Z23 Encounter for immunization: Secondary | ICD-10-CM | POA: Diagnosis not present

## 2018-12-20 DIAGNOSIS — R600 Localized edema: Secondary | ICD-10-CM

## 2018-12-20 DIAGNOSIS — J0100 Acute maxillary sinusitis, unspecified: Secondary | ICD-10-CM | POA: Diagnosis not present

## 2018-12-20 MED ORDER — DOXYCYCLINE HYCLATE 100 MG PO TABS: 100.0000 mg | ORAL_TABLET | Freq: Two times a day (BID) | ORAL | 0 refills | Status: AC

## 2018-12-20 NOTE — Patient Instructions (Signed)
DASH Eating Plan  DASH stands for "Dietary Approaches to Stop Hypertension." The DASH eating plan is a healthy eating plan that has been shown to reduce high blood pressure (hypertension). It may also reduce your risk for type 2 diabetes, heart disease, and stroke. The DASH eating plan may also help with weight loss.  What are tips for following this plan?    General guidelines   Avoid eating more than 2,300 mg (milligrams) of salt (sodium) a day. If you have hypertension, you may need to reduce your sodium intake to 1,500 mg a day.   Limit alcohol intake to no more than 1 drink a day for nonpregnant women and 2 drinks a day for men. One drink equals 12 oz of beer, 5 oz of wine, or 1 oz of hard liquor.   Work with your health care provider to maintain a healthy body weight or to lose weight. Ask what an ideal weight is for you.   Get at least 30 minutes of exercise that causes your heart to beat faster (aerobic exercise) most days of the week. Activities may include walking, swimming, or biking.   Work with your health care provider or diet and nutrition specialist (dietitian) to adjust your eating plan to your individual calorie needs.  Reading food labels     Check food labels for the amount of sodium per serving. Choose foods with less than 5 percent of the Daily Value of sodium. Generally, foods with less than 300 mg of sodium per serving fit into this eating plan.   To find whole grains, look for the word "whole" as the first word in the ingredient list.  Shopping   Buy products labeled as "low-sodium" or "no salt added."   Buy fresh foods. Avoid canned foods and premade or frozen meals.  Cooking   Avoid adding salt when cooking. Use salt-free seasonings or herbs instead of table salt or sea salt. Check with your health care provider or pharmacist before using salt substitutes.   Do not fry foods. Cook foods using healthy methods such as baking, boiling, grilling, and broiling instead.   Cook with  heart-healthy oils, such as olive, canola, soybean, or sunflower oil.  Meal planning   Eat a balanced diet that includes:  ? 5 or more servings of fruits and vegetables each day. At each meal, try to fill half of your plate with fruits and vegetables.  ? Up to 6-8 servings of whole grains each day.  ? Less than 6 oz of lean meat, poultry, or fish each day. A 3-oz serving of meat is about the same size as a deck of cards. One egg equals 1 oz.  ? 2 servings of low-fat dairy each day.  ? A serving of nuts, seeds, or beans 5 times each week.  ? Heart-healthy fats. Healthy fats called Omega-3 fatty acids are found in foods such as flaxseeds and coldwater fish, like sardines, salmon, and mackerel.   Limit how much you eat of the following:  ? Canned or prepackaged foods.  ? Food that is high in trans fat, such as fried foods.  ? Food that is high in saturated fat, such as fatty meat.  ? Sweets, desserts, sugary drinks, and other foods with added sugar.  ? Full-fat dairy products.   Do not salt foods before eating.   Try to eat at least 2 vegetarian meals each week.   Eat more home-cooked food and less restaurant, buffet, and fast food.     When eating at a restaurant, ask that your food be prepared with less salt or no salt, if possible.  What foods are recommended?  The items listed may not be a complete list. Talk with your dietitian about what dietary choices are best for you.  Grains  Whole-grain or whole-wheat bread. Whole-grain or whole-wheat pasta. Brown rice. Oatmeal. Quinoa. Bulgur. Whole-grain and low-sodium cereals. Pita bread. Low-fat, low-sodium crackers. Whole-wheat flour tortillas.  Vegetables  Fresh or frozen vegetables (raw, steamed, roasted, or grilled). Low-sodium or reduced-sodium tomato and vegetable juice. Low-sodium or reduced-sodium tomato sauce and tomato paste. Low-sodium or reduced-sodium canned vegetables.  Fruits  All fresh, dried, or frozen fruit. Canned fruit in natural juice (without  added sugar).  Meat and other protein foods  Skinless chicken or turkey. Ground chicken or turkey. Pork with fat trimmed off. Fish and seafood. Egg whites. Dried beans, peas, or lentils. Unsalted nuts, nut butters, and seeds. Unsalted canned beans. Lean cuts of beef with fat trimmed off. Low-sodium, lean deli meat.  Dairy  Low-fat (1%) or fat-free (skim) milk. Fat-free, low-fat, or reduced-fat cheeses. Nonfat, low-sodium ricotta or cottage cheese. Low-fat or nonfat yogurt. Low-fat, low-sodium cheese.  Fats and oils  Soft margarine without trans fats. Vegetable oil. Low-fat, reduced-fat, or light mayonnaise and salad dressings (reduced-sodium). Canola, safflower, olive, soybean, and sunflower oils. Avocado.  Seasoning and other foods  Herbs. Spices. Seasoning mixes without salt. Unsalted popcorn and pretzels. Fat-free sweets.  What foods are not recommended?  The items listed may not be a complete list. Talk with your dietitian about what dietary choices are best for you.  Grains  Baked goods made with fat, such as croissants, muffins, or some breads. Dry pasta or rice meal packs.  Vegetables  Creamed or fried vegetables. Vegetables in a cheese sauce. Regular canned vegetables (not low-sodium or reduced-sodium). Regular canned tomato sauce and paste (not low-sodium or reduced-sodium). Regular tomato and vegetable juice (not low-sodium or reduced-sodium). Pickles. Olives.  Fruits  Canned fruit in a light or heavy syrup. Fried fruit. Fruit in cream or butter sauce.  Meat and other protein foods  Fatty cuts of meat. Ribs. Fried meat. Bacon. Sausage. Bologna and other processed lunch meats. Salami. Fatback. Hotdogs. Bratwurst. Salted nuts and seeds. Canned beans with added salt. Canned or smoked fish. Whole eggs or egg yolks. Chicken or turkey with skin.  Dairy  Whole or 2% milk, cream, and half-and-half. Whole or full-fat cream cheese. Whole-fat or sweetened yogurt. Full-fat cheese. Nondairy creamers. Whipped toppings.  Processed cheese and cheese spreads.  Fats and oils  Butter. Stick margarine. Lard. Shortening. Ghee. Bacon fat. Tropical oils, such as coconut, palm kernel, or palm oil.  Seasoning and other foods  Salted popcorn and pretzels. Onion salt, garlic salt, seasoned salt, table salt, and sea salt. Worcestershire sauce. Tartar sauce. Barbecue sauce. Teriyaki sauce. Soy sauce, including reduced-sodium. Steak sauce. Canned and packaged gravies. Fish sauce. Oyster sauce. Cocktail sauce. Horseradish that you find on the shelf. Ketchup. Mustard. Meat flavorings and tenderizers. Bouillon cubes. Hot sauce and Tabasco sauce. Premade or packaged marinades. Premade or packaged taco seasonings. Relishes. Regular salad dressings.  Where to find more information:   National Heart, Lung, and Blood Institute: www.nhlbi.nih.gov   American Heart Association: www.heart.org  Summary   The DASH eating plan is a healthy eating plan that has been shown to reduce high blood pressure (hypertension). It may also reduce your risk for type 2 diabetes, heart disease, and stroke.   With the   DASH eating plan, you should limit salt (sodium) intake to 2,300 mg a day. If you have hypertension, you may need to reduce your sodium intake to 1,500 mg a day.   When on the DASH eating plan, aim to eat more fresh fruits and vegetables, whole grains, lean proteins, low-fat dairy, and heart-healthy fats.   Work with your health care provider or diet and nutrition specialist (dietitian) to adjust your eating plan to your individual calorie needs.  This information is not intended to replace advice given to you by your health care provider. Make sure you discuss any questions you have with your health care provider.  Document Released: 10/28/2011 Document Revised: 11/01/2016 Document Reviewed: 11/01/2016  Elsevier Interactive Patient Education  2019 [employer]vier Inc[employer]

## 2018-12-20 NOTE — Progress Notes (Signed)
Date:  12/20/2018   Name:  Katherine Adkins   DOB:  May 24, 1976   MRN:  [id number]   Chief Complaint: Facial Pain (Facial pain and tightness. Throat sore. Pain radiates from throat around to the top of right side of head. Dentist said BP was high and swollen lymph nodes in throat. Neck feels swollen.) and Hypertension  Hypertension  This is a new (noted at dentist with wrist cuff) problem. Associated symptoms include peripheral edema (mild in hands and ankles). Pertinent negatives include no anxiety, chest pain, headaches, palpitations or shortness of breath. There are no associated agents to hypertension. There are no known risk factors for coronary artery disease.  Sore Throat   This is a new problem. The current episode started in the past 7 days. There has been no fever. Associated symptoms include congestion. Pertinent negatives include no coughing, ear pain, headaches, shortness of breath or trouble swallowing. She has tried nothing for the symptoms.    Review of Systems  Constitutional: Negative for chills, fatigue and fever.  HENT: Positive for congestion and sinus pressure. Negative for ear pain and trouble swallowing.   Respiratory: Negative for cough, chest tightness and shortness of breath.   Cardiovascular: Negative for chest pain and palpitations.  Neurological: Negative for dizziness, light-headedness and headaches.  Hematological: Positive for adenopathy.    Patient Active Problem List   Diagnosis Date Noted  . Asthma with acute exacerbation in adult 10/31/2018  . Personal history of colonic polyps   . Benign neoplasm of descending colon   . Airway hyperreactivity 11/05/2015  . Elevated blood pressure, situational 11/05/2015  . History of colon polyps 11/05/2015  . Cannot sleep 11/05/2015  . Low back strain 11/05/2015  . Snores 11/05/2015    No Known Allergies  Past Surgical History:  Procedure Laterality Date  . CESAREAN SECTION  2004, 2007  .  COLONOSCOPY  2007   [facility] - single benign polyp  . COLONOSCOPY WITH PROPOFOL N/A 05/31/2016   Procedure: COLONOSCOPY WITH PROPOFOL;  Surgeon: Lucilla Lame, MD;  Location: [facility];  Service: Endoscopy;  Laterality: N/A;  . POLYPECTOMY  05/31/2016   Procedure: POLYPECTOMY;  Surgeon: Lucilla Lame, MD;  Location: [facility];  Service: Endoscopy;;    Social History   Tobacco Use  . Smoking status: Never Smoker  . Smokeless tobacco: Never Used  Substance Use Topics  . Alcohol use: No    Alcohol/week: 0.0 standard drinks    Comment: 1 glass of wine per month  . Drug use: No     Medication list has been reviewed and updated.  Current Meds  Medication Sig  . albuterol (PROAIR HFA) 108 (90 Base) MCG/ACT inhaler Inhale 2 puffs into the lungs every 4 (four) hours as needed for wheezing or shortness of breath.  . Multiple Vitamins-Minerals (MULTIVITAMIN WITH MINERALS) tablet Take 1 tablet by mouth daily.  Marland Kitchen triamcinolone (KENALOG) 0.025 % cream Apply topically 2 (two) times daily.    PHQ 2/9 Scores 10/31/2018 05/20/2016  PHQ - 2 Score 0 0    Physical Exam Constitutional:      Appearance: She is well-developed.  HENT:     Right Ear: Ear canal and external ear normal. Tympanic membrane is not erythematous or retracted.     Left Ear: Ear canal and external ear normal. Tympanic membrane is not erythematous or retracted.     Nose:     Right Sinus: Maxillary sinus tenderness and frontal sinus tenderness present.  Left Sinus: Maxillary sinus tenderness and frontal sinus tenderness present.     Mouth/Throat:     Mouth: No oral lesions.     Pharynx: Uvula midline. No oropharyngeal exudate or posterior oropharyngeal erythema.  Cardiovascular:     Rate and Rhythm: Normal rate and regular rhythm.     Heart sounds: Normal heart sounds.  Pulmonary:     Breath sounds: Normal breath sounds. No wheezing or rales.  Lymphadenopathy:     Cervical: Cervical adenopathy (on  anterior right side) present.  Neurological:     Mental Status: She is alert and oriented to person, place, and time.    BP 122/82 on right sitting;   BP 120/78 on left sitting BP 124/80   Pulse (!) 114   Ht 5\' 7"  (1.702 m)   Wt 266 lb 9.6 oz (120.9 kg)   SpO2 96%   BMI 41.76 kg/m   Assessment and Plan: 1. Acute non-recurrent maxillary sinusitis Follow up if adenopathy persists - doxycycline (VIBRA-TABS) 100 MG tablet; Take 1 tablet (100 mg total) by mouth 2 (two) times daily for 10 days.  Dispense: 20 tablet; Refill: 0  2. Localized edema Restrict sodium, elevate legs, increase water, increase exercise    Partially dictated using Editor, commissioning. Any errors are unintentional.  Halina Maidens, MD [facility]  12/20/2018

## 2019-02-23 ENCOUNTER — Encounter: Payer: Self-pay | Admitting: Internal Medicine

## 2019-02-23 ENCOUNTER — Other Ambulatory Visit: Payer: Self-pay

## 2019-02-23 ENCOUNTER — Ambulatory Visit (INDEPENDENT_AMBULATORY_CARE_PROVIDER_SITE_OTHER): Payer: BLUE CROSS/BLUE SHIELD | Admitting: Internal Medicine

## 2019-02-23 VITALS — BP 138/84 | HR 110 | Ht 67.0 in | Wt 258.0 lb

## 2019-02-23 DIAGNOSIS — S39012A Strain of muscle, fascia and tendon of lower back, initial encounter: Secondary | ICD-10-CM | POA: Diagnosis not present

## 2019-02-23 MED ORDER — TIZANIDINE HCL 4 MG PO CAPS: 4.0000 mg | ORAL_CAPSULE | Freq: Three times a day (TID) | ORAL | 0 refills | Status: DC

## 2019-02-23 NOTE — Progress Notes (Signed)
Date:  02/23/2019   Name:  Katherine Adkins   DOB:  [birth year]   MRN:  [id number]   Chief Complaint: Back Pain (Pain started Sunday after working in house Saturday. Cannot bend at all. Cannot sit without getting stuck. Having tingling in thighs since pain started. Right sided lower back pain. Radiates through right hip.  )  Back Pain  This is a new problem. The current episode started in the past 7 days. The problem occurs constantly. The problem has been gradually improving (about 50% better than 5 days ago) since onset. The pain is present in the lumbar spine. Radiates to: from the mid low back to the right lateral hip. The pain is moderate. The symptoms are aggravated by sitting and twisting. Pertinent negatives include no chest pain, fever, headaches or numbness (some tingling in anterior right thigh). She has tried analgesics, heat and muscle relaxant for the symptoms. The treatment provided moderate relief.    Review of Systems  Constitutional: Negative for chills, fatigue and fever.  Respiratory: Negative for shortness of breath.   Cardiovascular: Negative for chest pain, palpitations and leg swelling.  Musculoskeletal: Positive for back pain.  Neurological: Negative for dizziness, numbness (some tingling in anterior right thigh) and headaches.  Psychiatric/Behavioral: Positive for sleep disturbance (due to pain).    Patient Active Problem List   Diagnosis Date Noted  . Asthma with acute exacerbation in adult 10/31/2018  . Personal history of colonic polyps   . Benign neoplasm of descending colon   . Airway hyperreactivity 11/05/2015  . Elevated blood pressure, situational 11/05/2015  . History of colon polyps 11/05/2015  . Cannot sleep 11/05/2015  . Low back strain 11/05/2015  . Snores 11/05/2015    No Known Allergies  Past Surgical History:  Procedure Laterality Date  . CESAREAN SECTION  2004, 2007  . COLONOSCOPY  2007   [facility] - single benign polyp  .  COLONOSCOPY WITH PROPOFOL N/A 05/31/2016   Procedure: COLONOSCOPY WITH PROPOFOL;  Surgeon: Lucilla Lame, MD;  Location: [facility];  Service: Endoscopy;  Laterality: N/A;  . POLYPECTOMY  05/31/2016   Procedure: POLYPECTOMY;  Surgeon: Lucilla Lame, MD;  Location: [facility];  Service: Endoscopy;;    Social History   Tobacco Use  . Smoking status: Never Smoker  . Smokeless tobacco: Never Used  Substance Use Topics  . Alcohol use: No    Alcohol/week: 0.0 standard drinks    Comment: 1 glass of wine per month  . Drug use: No     Medication list has been reviewed and updated.  Current Meds  Medication Sig  . albuterol (PROAIR HFA) 108 (90 Base) MCG/ACT inhaler Inhale 2 puffs into the lungs every 4 (four) hours as needed for wheezing or shortness of breath.  . Multiple Vitamins-Minerals (MULTIVITAMIN WITH MINERALS) tablet Take 1 tablet by mouth daily.  Marland Kitchen tiZANidine (ZANAFLEX) 4 MG capsule Take 4 mg by mouth 3 (three) times daily.  Marland Kitchen triamcinolone (KENALOG) 0.025 % cream Apply topically 2 (two) times daily.    PHQ 2/9 Scores 02/23/2019 12/20/2018 10/31/2018 05/20/2016  PHQ - 2 Score 1 0 0 0    BP Readings from Last 3 Encounters:  02/23/19 138/84  12/20/18 124/80  10/31/18 122/78    Physical Exam Vitals signs and nursing note reviewed.  Constitutional:      General: She is not in acute distress.    Appearance: She is well-developed.  HENT:     Head: Normocephalic and atraumatic.  Neck:     Musculoskeletal: Normal range of motion.  Cardiovascular:     Rate and Rhythm: Normal rate and regular rhythm.  Pulmonary:     Effort: Pulmonary effort is normal. No respiratory distress.     Breath sounds: Normal breath sounds.  Musculoskeletal:     Lumbar back: She exhibits tenderness and spasm.  Skin:    General: Skin is warm and dry.     Findings: No rash.  Neurological:     Mental Status: She is alert and oriented to person, place, and time.     Sensory: Sensation  is intact.     Gait: Gait is intact.  Psychiatric:        Behavior: Behavior normal.        Thought Content: Thought content normal.     Wt Readings from Last 3 Encounters:  02/23/19 258 lb (117 kg)  12/20/18 266 lb 9.6 oz (120.9 kg)  10/31/18 259 lb (117.5 kg)    BP 138/84 (BP Location: Right Arm, Patient Position: Standing, Cuff Size: Large)   Pulse (!) 110   Ht 5\' 7"  (1.702 m)   Wt 258 lb (117 kg)   SpO2 96%   BMI 40.41 kg/m   Assessment and Plan: 1. Strain of lumbar region, initial encounter Continue tylenol tid, heat Consider PTx if no improvement next week - tiZANidine (ZANAFLEX) 4 MG capsule; Take 1 capsule (4 mg total) by mouth 3 (three) times daily.  Dispense: 60 capsule; Refill: 0   Partially dictated using Editor, commissioning. Any errors are unintentional.  Halina Maidens, MD [facility]  02/23/2019

## 2019-02-23 NOTE — Patient Instructions (Signed)
Continue Tylenol 500 mg - 2 tabs three times a day  Use heat an hour on and 2 hours off -

## 2019-08-23 ENCOUNTER — Ambulatory Visit: Payer: BC Managed Care – PPO | Admitting: Internal Medicine

## 2019-08-23 DIAGNOSIS — Z20828 Contact with and (suspected) exposure to other viral communicable diseases: Secondary | ICD-10-CM | POA: Diagnosis not present

## 2019-08-27 ENCOUNTER — Telehealth: Payer: Self-pay | Admitting: Internal Medicine

## 2019-08-27 NOTE — Telephone Encounter (Signed)
Called and spoke with pt. She tested Positive 3 days ago at [facility]. She is complaining of tingling in the bottom Rt foot, and in her Rt hand when she squeezes. She is concerned of a blood blot.   Told her if she is concerned she can take a baby aspirin every day to thin her blood per Dr. Army Melia, but otherwise we can address this 15 days after her positive Covid result in our office. She verbalized understanding.

## 2019-08-27 NOTE — Telephone Encounter (Signed)
Pt did test positive for covid, is having tingling in arms and legs. Want to set up a virtual visit if there is anything she can do or take to prevent blood clots. Please advise if a VV is needed.

## 2019-09-14 ENCOUNTER — Other Ambulatory Visit: Payer: Self-pay

## 2019-09-14 ENCOUNTER — Ambulatory Visit: Payer: BC Managed Care – PPO | Admitting: Internal Medicine

## 2019-09-14 ENCOUNTER — Encounter: Payer: Self-pay | Admitting: Internal Medicine

## 2019-09-14 VITALS — BP 138/92 | HR 85 | Temp 97.6°F | Ht 67.0 in | Wt 251.0 lb

## 2019-09-14 DIAGNOSIS — U071 COVID-19: Secondary | ICD-10-CM

## 2019-09-14 DIAGNOSIS — R202 Paresthesia of skin: Secondary | ICD-10-CM | POA: Diagnosis not present

## 2019-09-14 DIAGNOSIS — J4520 Mild intermittent asthma, uncomplicated: Secondary | ICD-10-CM

## 2019-09-14 DIAGNOSIS — Z23 Encounter for immunization: Secondary | ICD-10-CM | POA: Diagnosis not present

## 2019-09-14 DIAGNOSIS — J4541 Moderate persistent asthma with (acute) exacerbation: Secondary | ICD-10-CM

## 2019-09-14 MED ORDER — ALBUTEROL SULFATE HFA 108 (90 BASE) MCG/ACT IN AERS: 2.0000 | INHALATION_SPRAY | RESPIRATORY_TRACT | 3 refills | Status: DC | PRN

## 2019-09-14 NOTE — Progress Notes (Signed)
Date:  09/14/2019   Name:  Katherine Adkins   DOB:  1976/03/08   MRN:  [id number]   Chief Complaint: Follow up after Covid (Had low grade fever, felt like had sinus infection, scratchy throat, and tingling in nerves all over body. Still having it on and off on right arm/ hand and twi middle toes on right side. )  Asthma She complains of cough and wheezing. This is a recurrent problem. The problem occurs intermittently. The cough is non-productive. Pertinent negatives include no chest pain, fever, headaches or trouble swallowing. Her past medical history is significant for asthma.   Covid-19 sx were mild - low grade fever, congestion but not cough or SOB.  She has noted some tingling in right arm and foot intermittently.  All of the other symptoms resolved after a week with supportive care only. She is worried about covid toes. During her illness she took vitamin D, zinc, pepcid and aspirin 81 mg.  She is also taking multi-vitamins.  Review of Systems  Constitutional: Negative for chills, fatigue and fever.  HENT: Negative for trouble swallowing.   Respiratory: Positive for cough and wheezing. Negative for chest tightness.   Cardiovascular: Negative for chest pain, palpitations and leg swelling.  Gastrointestinal: Negative for abdominal pain, constipation and diarrhea.  Endocrine: Negative for polydipsia and polyuria.  Genitourinary: Negative for dysuria, frequency and urgency.  Skin: Negative for color change and rash.  Allergic/Immunologic: Negative for environmental allergies.  Neurological: Positive for numbness. Negative for dizziness, tremors, weakness, light-headedness and headaches.  Psychiatric/Behavioral: Negative for dysphoric mood and sleep disturbance. The patient is nervous/anxious.     Patient Active Problem List   Diagnosis Date Noted  . Mild intermittent asthma without complication [id number]  . Personal history of colonic polyps   . Benign neoplasm of  descending colon   . Elevated blood pressure, situational 11/05/2015  . History of colon polyps 11/05/2015  . Cannot sleep 11/05/2015  . Low back strain 11/05/2015  . Snores 11/05/2015    No Known Allergies  Past Surgical History:  Procedure Laterality Date  . CESAREAN SECTION  2004, 2007  . COLONOSCOPY  2007   [place] - single benign polyp  . COLONOSCOPY WITH PROPOFOL N/A 05/31/2016   Procedure: COLONOSCOPY WITH PROPOFOL;  Surgeon: Lucilla Lame, MD;  Location: [facility];  Service: Endoscopy;  Laterality: N/A;  . POLYPECTOMY  05/31/2016   Procedure: POLYPECTOMY;  Surgeon: Lucilla Lame, MD;  Location: [facility];  Service: Endoscopy;;    Social History   Tobacco Use  . Smoking status: Never Smoker  . Smokeless tobacco: Never Used  Substance Use Topics  . Alcohol use: No    Alcohol/week: 0.0 standard drinks    Comment: 1 glass of wine per month  . Drug use: No     Medication list has been reviewed and updated.  Current Meds  Medication Sig  . albuterol (PROAIR HFA) 108 (90 Base) MCG/ACT inhaler Inhale 2 puffs into the lungs every 4 (four) hours as needed for wheezing or shortness of breath.  Marland Kitchen aspirin EC 81 MG tablet Take 81 mg by mouth daily.  . budesonide-formoterol (SYMBICORT) 160-4.5 MCG/ACT inhaler Inhale 2 puffs into the lungs 2 (two) times daily.  . famotidine (PEPCID) 10 MG tablet Take 10 mg by mouth 2 (two) times daily.  . Multiple Vitamins-Minerals (MULTIVITAMIN WITH MINERALS) tablet Take 1 tablet by mouth daily.  Marland Kitchen tiZANidine (ZANAFLEX) 4 MG capsule Take 1 capsule (4 mg total) by  mouth 3 (three) times daily.  Marland Kitchen triamcinolone (KENALOG) 0.025 % cream Apply topically 2 (two) times daily.  . [DISCONTINUED] albuterol (PROAIR HFA) 108 (90 Base) MCG/ACT inhaler Inhale 2 puffs into the lungs every 4 (four) hours as needed for wheezing or shortness of breath.    PHQ 2/9 Scores 09/14/2019 02/23/2019 12/20/2018 10/31/2018  PHQ - 2 Score 2 1 0 0  PHQ- 9  Score 6 - - -   GAD 7 : Generalized Anxiety Score 09/14/2019  Nervous, Anxious, on Edge 3  Control/stop worrying 0  Worry too much - different things 3  Trouble relaxing 2  Restless 0  Easily annoyed or irritable 2  Afraid - awful might happen 0  Total GAD 7 Score 10  Anxiety Difficulty Somewhat difficult      BP Readings from Last 3 Encounters:  09/14/19 (!) 138/92  02/23/19 138/84  12/20/18 124/80    Physical Exam Vitals signs and nursing note reviewed.  Constitutional:      General: She is not in acute distress.    Appearance: She is well-developed.  HENT:     Head: Normocephalic and atraumatic.  Neck:     Musculoskeletal: Normal range of motion and neck supple.  Cardiovascular:     Rate and Rhythm: Normal rate and regular rhythm.  No extrasystoles are present.    Pulses:          Dorsalis pedis pulses are 1+ on the right side and 1+ on the left side.       Posterior tibial pulses are 1+ on the right side and 1+ on the left side.     Heart sounds: Normal heart sounds. No murmur.  Pulmonary:     Effort: Pulmonary effort is normal. No respiratory distress.     Breath sounds: No wheezing or rhonchi.  Musculoskeletal: Normal range of motion.     Right wrist: Normal.     Left wrist: Normal.       Arms:     Right lower leg: No edema.     Left lower leg: No edema.  Skin:    General: Skin is warm and dry.     Capillary Refill: Capillary refill takes less than 2 seconds.     Findings: No rash.  Neurological:     General: No focal deficit present.     Mental Status: She is alert and oriented to person, place, and time.     Cranial Nerves: Cranial nerves are intact.     Sensory: Sensation is intact.     Motor: Motor function is intact.     Coordination: Coordination is intact.     Gait: Gait is intact.  Psychiatric:        Attention and Perception: Attention normal.        Mood and Affect: Mood normal.        Speech: Speech normal.        Behavior: Behavior  normal.        Thought Content: Thought content normal.     Wt Readings from Last 3 Encounters:  09/14/19 251 lb (113.9 kg)  02/23/19 258 lb (117 kg)  12/20/18 266 lb 9.6 oz (120.9 kg)    BP (!) 138/92   Pulse 85   Temp 97.6 F (36.4 C) (Temporal)   Ht 5\' 7"  (1.702 m)   Wt 251 lb (113.9 kg)   SpO2 97%   BMI 39.31 kg/m   Assessment and Plan: 1. Mild intermittent asthma without complication  No respiratory complications from recent Covid infection - CBC with Differential/Platelet  2. COVID-19 virus infection Appears to have resolved except the possible tingling sensations notes in right forearm/wrist/hand and right dorsum of foot. Pt admits to significant anxiety but it is unclear how much of a role this is playing in her symptoms. Continue zinc, pepcid, Vitamin D indefinitely and  aspirin x 3 weeks then stop.  3. Tingling in extremities Motor and sensory exam normal No skin change, evidence of vascular compromise noted Pt reassured; will check electrolytes and thyroid functions - Comprehensive metabolic panel - TSH + free T4 - Magnesium  4. Moderate persistent asthma with acute exacerbation in adult - albuterol (PROAIR HFA) 108 (90 Base) MCG/ACT inhaler; Inhale 2 puffs into the lungs every 4 (four) hours as needed for wheezing or shortness of breath.  Dispense: 18 g; Refill: 3  5. Need for immunization against influenza - Flu Vaccine QUAD 36+ mos IM   Partially dictated using Editor, commissioning. Any errors are unintentional.  Halina Maidens, MD [facility]  09/14/2019

## 2019-09-15 LAB — CBC WITH DIFFERENTIAL/PLATELET
Basophils Absolute: 0 10*3/uL (ref 0.0–0.2)
Basos: 1 %
EOS (ABSOLUTE): 0.1 10*3/uL (ref 0.0–0.4)
Eos: 2 %
Hematocrit: 38.6 % (ref 34.0–46.6)
Hemoglobin: 12.7 g/dL (ref 11.1–15.9)
Immature Grans (Abs): 0 10*3/uL (ref 0.0–0.1)
Immature Granulocytes: 0 %
Lymphocytes Absolute: 2 10*3/uL (ref 0.7–3.1)
Lymphs: 38 %
MCH: 27.7 pg (ref 26.6–33.0)
MCHC: 32.9 g/dL (ref 31.5–35.7)
MCV: 84 fL (ref 79–97)
Monocytes Absolute: 0.8 10*3/uL (ref 0.1–0.9)
Monocytes: 15 %
Neutrophils Absolute: 2.3 10*3/uL (ref 1.4–7.0)
Neutrophils: 44 %
Platelets: 331 10*3/uL (ref 150–450)
RBC: 4.59 x10E6/uL (ref 3.77–5.28)
RDW: 12.6 % (ref 11.7–15.4)
WBC: 5.2 10*3/uL (ref 3.4–10.8)

## 2019-09-15 LAB — COMPREHENSIVE METABOLIC PANEL
ALT: 28 IU/L (ref 0–32)
AST: 20 IU/L (ref 0–40)
Albumin/Globulin Ratio: 1.4 (ref 1.2–2.2)
Albumin: 4.2 g/dL (ref 3.8–4.8)
Alkaline Phosphatase: 87 IU/L (ref 39–117)
BUN/Creatinine Ratio: 12 (ref 9–23)
BUN: 9 mg/dL (ref 6–24)
Bilirubin Total: 0.3 mg/dL (ref 0.0–1.2)
CO2: 22 mmol/L (ref 20–29)
Calcium: 9.6 mg/dL (ref 8.7–10.2)
Chloride: 103 mmol/L (ref 96–106)
Creatinine, Ser: 0.77 mg/dL (ref 0.57–1.00)
GFR calc Af Amer: 109 mL/min/{1.73_m2} (ref >59)
GFR calc non Af Amer: 94 mL/min/{1.73_m2} (ref >59)
Globulin, Total: 2.9 g/dL (ref 1.5–4.5)
Glucose: 82 mg/dL (ref 65–99)
Potassium: 4.5 mmol/L (ref 3.5–5.2)
Sodium: 137 mmol/L (ref 134–144)
Total Protein: 7.1 g/dL (ref 6.0–8.5)

## 2019-09-15 LAB — TSH+FREE T4
Free T4: 1.26 ng/dL (ref 0.82–1.77)
TSH: 2.78 u[IU]/mL (ref 0.450–4.500)

## 2019-09-15 LAB — MAGNESIUM: Magnesium: 2.2 mg/dL (ref 1.6–2.3)

## 2019-10-27 DIAGNOSIS — Z20828 Contact with and (suspected) exposure to other viral communicable diseases: Secondary | ICD-10-CM | POA: Diagnosis not present

## 2019-12-03 DIAGNOSIS — L239 Allergic contact dermatitis, unspecified cause: Secondary | ICD-10-CM | POA: Diagnosis not present

## 2019-12-03 DIAGNOSIS — M67479 Ganglion, unspecified ankle and foot: Secondary | ICD-10-CM | POA: Diagnosis not present

## 2019-12-03 DIAGNOSIS — Z7689 Persons encountering health services in other specified circumstances: Secondary | ICD-10-CM | POA: Diagnosis not present

## 2019-12-11 ENCOUNTER — Ambulatory Visit: Payer: BC Managed Care – PPO | Admitting: Internal Medicine

## 2019-12-25 DIAGNOSIS — X500XXA Overexertion from strenuous movement or load, initial encounter: Secondary | ICD-10-CM | POA: Diagnosis not present

## 2019-12-25 DIAGNOSIS — Q666 Other congenital valgus deformities of feet: Secondary | ICD-10-CM | POA: Diagnosis not present

## 2019-12-25 DIAGNOSIS — M79671 Pain in right foot: Secondary | ICD-10-CM | POA: Diagnosis not present

## 2019-12-25 DIAGNOSIS — M7731 Calcaneal spur, right foot: Secondary | ICD-10-CM | POA: Diagnosis not present

## 2019-12-25 DIAGNOSIS — M7989 Other specified soft tissue disorders: Secondary | ICD-10-CM | POA: Diagnosis not present

## 2019-12-25 DIAGNOSIS — M67479 Ganglion, unspecified ankle and foot: Secondary | ICD-10-CM | POA: Diagnosis not present

## 2019-12-25 DIAGNOSIS — S93491A Sprain of other ligament of right ankle, initial encounter: Secondary | ICD-10-CM | POA: Diagnosis not present

## 2020-04-03 DIAGNOSIS — M5412 Radiculopathy, cervical region: Secondary | ICD-10-CM | POA: Diagnosis not present

## 2020-04-18 DIAGNOSIS — M542 Cervicalgia: Secondary | ICD-10-CM | POA: Diagnosis not present

## 2020-04-18 DIAGNOSIS — M549 Dorsalgia, unspecified: Secondary | ICD-10-CM | POA: Diagnosis not present

## 2020-04-18 DIAGNOSIS — M47812 Spondylosis without myelopathy or radiculopathy, cervical region: Secondary | ICD-10-CM | POA: Diagnosis not present

## 2020-04-18 DIAGNOSIS — M7918 Myalgia, other site: Secondary | ICD-10-CM | POA: Diagnosis not present

## 2020-07-17 DIAGNOSIS — Z20822 Contact with and (suspected) exposure to covid-19: Secondary | ICD-10-CM | POA: Diagnosis not present

## 2020-08-25 ENCOUNTER — Other Ambulatory Visit: Payer: Self-pay

## 2020-08-25 ENCOUNTER — Telehealth: Payer: Self-pay

## 2020-08-25 DIAGNOSIS — J4541 Moderate persistent asthma with (acute) exacerbation: Secondary | ICD-10-CM

## 2020-08-25 MED ORDER — ALBUTEROL SULFATE HFA 108 (90 BASE) MCG/ACT IN AERS: 2.0000 | INHALATION_SPRAY | RESPIRATORY_TRACT | 3 refills | Status: DC | PRN

## 2020-08-25 NOTE — Telephone Encounter (Signed)
Pt says she needs a generic one called in, needs the prescription changed. "Brand Only" needs to be taken off.

## 2020-08-25 NOTE — Telephone Encounter (Unsigned)
Copied from [place] [id number]. Topic: General - Inquiry >> Aug 25, 2020 12:51 PM Greggory Keen D wrote: Reason for CRM: Pt called saying pharmacy told her the insurance would not pay for the Pro-air.  Pt said she would be willing to use what ever Dr. Army Melia suggest for her.   [phone]

## 2020-08-27 ENCOUNTER — Encounter: Payer: Self-pay | Admitting: Internal Medicine

## 2020-08-27 ENCOUNTER — Other Ambulatory Visit: Payer: Self-pay

## 2020-08-27 ENCOUNTER — Ambulatory Visit: Payer: BC Managed Care – PPO | Admitting: Internal Medicine

## 2020-08-27 VITALS — BP 132/86 | HR 76 | Ht 67.0 in | Wt 261.0 lb

## 2020-08-27 DIAGNOSIS — J4530 Mild persistent asthma, uncomplicated: Secondary | ICD-10-CM

## 2020-08-27 DIAGNOSIS — Z23 Encounter for immunization: Secondary | ICD-10-CM | POA: Diagnosis not present

## 2020-08-27 DIAGNOSIS — K219 Gastro-esophageal reflux disease without esophagitis: Secondary | ICD-10-CM

## 2020-08-27 MED ORDER — MONTELUKAST SODIUM 10 MG PO TABS: 10.0000 mg | ORAL_TABLET | Freq: Every day | ORAL | 3 refills | Status: DC

## 2020-08-27 MED ORDER — OMEPRAZOLE 20 MG PO CPDR: 20.0000 mg | DELAYED_RELEASE_CAPSULE | Freq: Every day | ORAL | 0 refills | Status: DC

## 2020-08-27 MED ORDER — BUDESONIDE-FORMOTEROL FUMARATE 80-4.5 MCG/ACT IN AERO: 2.0000 | INHALATION_SPRAY | Freq: Two times a day (BID) | RESPIRATORY_TRACT | 5 refills | Status: DC

## 2020-08-27 NOTE — Progress Notes (Signed)
Date:  08/27/2020   Name:  Katherine Adkins   DOB:  27-Apr-1976   MRN:  [id number]   Chief Complaint: Asthma (X 1 month getting worse. Having SOB, tightness in chest, and itching on chest. Using proair inhaler 2-3 times daily and never had this before. ) and Gastroesophageal Reflux (On and off 1 month now. Burning in her throat. )  Asthma She complains of chest tightness, shortness of breath and wheezing. There is no hoarse voice. The current episode started more than 1 month ago. The problem occurs daily. Associated symptoms include heartburn. Pertinent negatives include no chest pain, fever or headaches. Her symptoms are aggravated by any activity. Her symptoms are alleviated by beta-agonist. She reports significant (but only temporary) improvement on treatment. Her past medical history is significant for asthma.  Gastroesophageal Reflux She complains of heartburn and wheezing. She reports no chest pain, no choking or no hoarse voice. This is a new problem. The problem has been unchanged. The heartburn duration is several minutes. The heartburn is located in the substernum. The heartburn is of mild intensity. Pertinent negatives include no fatigue.    Lab Results  Component Value Date   CREATININE 0.77 09/14/2019   BUN 9 09/14/2019   NA 137 09/14/2019   K 4.5 09/14/2019   CL 103 09/14/2019   CO2 22 09/14/2019   Lab Results  Component Value Date   CHOL 119 05/20/2016   HDL 55 05/20/2016   LDLCALC 48 05/20/2016   TRIG 82 05/20/2016   CHOLHDL 2.2 05/20/2016   Lab Results  Component Value Date   TSH 2.780 09/14/2019   No results found for: HGBA1C Lab Results  Component Value Date   WBC 5.2 09/14/2019   HGB 12.7 09/14/2019   HCT 38.6 09/14/2019   MCV 84 09/14/2019   PLT 331 09/14/2019   Lab Results  Component Value Date   ALT 28 09/14/2019   AST 20 09/14/2019   ALKPHOS 87 09/14/2019   BILITOT 0.3 09/14/2019     Review of Systems  Constitutional: Negative for  chills, fatigue, fever and unexpected weight change.  HENT: Negative for hoarse voice.   Respiratory: Positive for chest tightness, shortness of breath and wheezing. Negative for choking.   Cardiovascular: Negative for chest pain and palpitations.  Gastrointestinal: Positive for heartburn. Negative for blood in stool, constipation, diarrhea and vomiting.  Neurological: Negative for dizziness and headaches.    Patient Active Problem List   Diagnosis Date Noted  . Mild intermittent asthma without complication 71/24/5809  . Personal history of colonic polyps   . Benign neoplasm of descending colon   . Elevated blood pressure, situational 11/05/2015  . History of colon polyps 11/05/2015  . Cannot sleep 11/05/2015  . Low back strain 11/05/2015  . Snores 11/05/2015    No Known Allergies  Past Surgical History:  Procedure Laterality Date  . CESAREAN SECTION  2004, 2007  . COLONOSCOPY  2007   [facility] - single benign polyp  . COLONOSCOPY WITH PROPOFOL N/A 05/31/2016   Procedure: COLONOSCOPY WITH PROPOFOL;  Surgeon: Lucilla Lame, MD;  Location: [facility];  Service: Endoscopy;  Laterality: N/A;  . POLYPECTOMY  05/31/2016   Procedure: POLYPECTOMY;  Surgeon: Lucilla Lame, MD;  Location: [facility];  Service: Endoscopy;;    Social History   Tobacco Use  . Smoking status: Never Smoker  . Smokeless tobacco: Never Used  Substance Use Topics  . Alcohol use: No    Alcohol/week: 0.0 standard  drinks    Comment: 1 glass of wine per month  . Drug use: No     Medication list has been reviewed and updated.  Current Meds  Medication Sig  . albuterol (PROAIR HFA) 108 (90 Base) MCG/ACT inhaler Inhale 2 puffs into the lungs every 4 (four) hours as needed for wheezing or shortness of breath.  . Multiple Vitamins-Minerals (MULTIVITAMIN WITH MINERALS) tablet Take 1 tablet by mouth daily.  Marland Kitchen triamcinolone (KENALOG) 0.025 % cream Apply topically 2 (two) times daily.    PHQ 2/9  Scores 09/14/2019 02/23/2019 12/20/2018 10/31/2018  PHQ - 2 Score 2 1 0 0  PHQ- 9 Score 6 - - -    GAD 7 : Generalized Anxiety Score 09/14/2019  Nervous, Anxious, on Edge 3  Control/stop worrying 0  Worry too much - different things 3  Trouble relaxing 2  Restless 0  Easily annoyed or irritable 2  Afraid - awful might happen 0  Total GAD 7 Score 10  Anxiety Difficulty Somewhat difficult    BP Readings from Last 3 Encounters:  08/27/20 132/86  09/14/19 (!) 138/92  02/23/19 138/84    Physical Exam Vitals and nursing note reviewed.  Constitutional:      General: She is not in acute distress.    Appearance: Normal appearance. She is well-developed.  HENT:     Head: Normocephalic and atraumatic.  Cardiovascular:     Rate and Rhythm: Normal rate and regular rhythm.     Pulses: Normal pulses.  Pulmonary:     Effort: Pulmonary effort is normal. Prolonged expiration present. No accessory muscle usage or respiratory distress.     Breath sounds: Normal breath sounds. No wheezing.  Musculoskeletal:        General: Normal range of motion.     Cervical back: Normal range of motion.  Lymphadenopathy:     Cervical: No cervical adenopathy.  Skin:    General: Skin is warm and dry.     Capillary Refill: Capillary refill takes less than 2 seconds.     Findings: No rash.  Neurological:     General: No focal deficit present.     Mental Status: She is alert and oriented to person, place, and time.  Psychiatric:        Mood and Affect: Mood normal.     Wt Readings from Last 3 Encounters:  08/27/20 261 lb (118.4 kg)  09/14/19 251 lb (113.9 kg)  02/23/19 258 lb (117 kg)    BP 132/86   Pulse 76   Ht 5\' 7"  (1.702 m)   Wt 261 lb (118.4 kg)   LMP  (Exact Date)   SpO2 99%   BMI 40.88 kg/m   Assessment and Plan: 1. Mild persistent asthma without complication Need to resume Symbicort 80 2 puffs bid for 2 weeks then decrease to 1 puff bid if able Begin Singulari 10 mg  daily Continue to use albuterol as needed Follow up in 2 months - montelukast (SINGULAIR) 10 MG tablet; Take 1 tablet (10 mg total) by mouth at bedtime.  Dispense: 30 tablet; Refill: 3 - budesonide-formoterol (SYMBICORT) 80-4.5 MCG/ACT inhaler; Inhale 2 puffs into the lungs in the morning and at bedtime.  Dispense: 1 each; Refill: 5  2. Gastroesophageal reflux disease, unspecified whether esophagitis present Mild intermittent sx may be contributing One month trial of omeprazole daily - omeprazole (PRILOSEC) 20 MG capsule; Take 1 capsule (20 mg total) by mouth daily.  Dispense: 30 capsule; Refill: 0  3. Need  for immunization against influenza - Flu Vaccine QUAD 36+ mos IM   Partially dictated using Editor, commissioning. Any errors are unintentional.  Halina Maidens, MD [facility]  08/27/2020

## 2020-10-01 DIAGNOSIS — Z1231 Encounter for screening mammogram for malignant neoplasm of breast: Secondary | ICD-10-CM | POA: Diagnosis not present

## 2020-10-01 LAB — HM MAMMOGRAPHY

## 2020-11-22 ENCOUNTER — Other Ambulatory Visit: Payer: Self-pay | Admitting: Internal Medicine

## 2020-11-22 DIAGNOSIS — J4530 Mild persistent asthma, uncomplicated: Secondary | ICD-10-CM

## 2021-06-02 ENCOUNTER — Telehealth: Payer: Self-pay

## 2021-06-02 NOTE — Telephone Encounter (Signed)
Please call the patient to schedule a physical with pap smear. She is due.  Thank you.

## 2021-06-02 NOTE — Telephone Encounter (Signed)
Left voice mail about physical and pap

## 2021-08-19 ENCOUNTER — Ambulatory Visit: Payer: Self-pay | Admitting: *Deleted

## 2021-08-19 ENCOUNTER — Telehealth: Payer: BC Managed Care – PPO | Admitting: Internal Medicine

## 2021-08-19 NOTE — Telephone Encounter (Signed)
Reason for Disposition  [1] MODERATE pain (e.g., interferes with normal activities) AND [2] pain comes and goes (cramps) AND [3] present > 24 hours  (Exception: pain with Vomiting or Diarrhea - see that Guideline)  Answer Assessment - Initial Assessment Questions 1. LOCATION: "Where does it hurt?"      Low abdominal area 2. RADIATION: "Does the pain shoot anywhere else?" (e.g., chest, back)     Radiate to back last night  3. ONSET: "When did the pain begin?" (e.g., minutes, hours or days ago)      Last night  ,around 1:30 am  4. SUDDEN: "Gradual or sudden onset?"     Sudden  5. PATTERN "Does the pain come and go, or is it constant?"    - If constant: "Is it getting better, staying the same, or worsening?"      (Note: Constant means the pain never goes away completely; most serious pain is constant and it progresses)     - If intermittent: "How long does it last?" "Do you have pain now?"     (Note: Intermittent means the pain goes away completely between bouts)     Na  6. SEVERITY: "How bad is the pain?"  (e.g., Scale 1-10; mild, moderate, or severe)   - MILD (1-3): doesn't interfere with normal activities, abdomen soft and not tender to touch    - MODERATE (4-7): interferes with normal activities or awakens from sleep, abdomen tender to touch    - SEVERE (8-10): excruciating pain, doubled over, unable to do any normal activities      Moderate  7. RECURRENT SYMPTOM: "Have you ever had this type of stomach pain before?" If Yes, ask: "When was the last time?" and "What happened that time?"      No  8. CAUSE: "What do you think is causing the stomach pain?"     Not sure ate tacos , no one else in family had symptoms  9. RELIEVING/AGGRAVATING FACTORS: "What makes it better or worse?" (e.g., movement, antacids, bowel movement)     Peppermint tea makes better  10. OTHER SYMPTOMS: "Do you have any other symptoms?" (e.g., back pain, diarrhea, fever, urination pain, vomiting)      Abdomen sore  tender to touch 11. PREGNANCY: "Is there any chance you are pregnant?" "When was your last menstrual period?"       Na LMP every other month  Protocols used: Abdominal Pain - [facility]

## 2021-08-19 NOTE — Telephone Encounter (Signed)
C/o severe abdominal pain last night, early am 1:30 am. Woke up with sudden low abdominal pain radiating to back. Patient thought she had to go to bathroom for BM and unable to go and reports feeling like "gas bubble' causing pain. Unable to stand up and layed on floor. Took tums and drank water and vomited x 2. Patient reports she "never vomits". C/o abdominal tenderness low abd now . Denies pain now like last night's episode. Reports abd sore to touch. Patients' son was dx covid positive last week and on day 7 now. My Chart vv scheduled today. Care advise given. Patient verbalized understanding of care advise and to call back or go to [facility] or ED if symptoms worsen.

## 2021-08-20 ENCOUNTER — Ambulatory Visit: Payer: BC Managed Care – PPO | Admitting: Internal Medicine

## 2021-08-20 ENCOUNTER — Encounter: Payer: Self-pay | Admitting: Internal Medicine

## 2021-08-20 ENCOUNTER — Other Ambulatory Visit: Payer: Self-pay

## 2021-08-20 VITALS — BP 132/80 | HR 85 | Temp 98.7°F | Ht 67.0 in | Wt 261.8 lb

## 2021-08-20 DIAGNOSIS — N3001 Acute cystitis with hematuria: Secondary | ICD-10-CM

## 2021-08-20 LAB — POC URINALYSIS WITH MICROSCOPIC (NON AUTO)MANUAL RESULT
Bilirubin, UA: NEGATIVE
Crystals: 0
Glucose, UA: NEGATIVE
Ketones, UA: NEGATIVE
Leukocytes, UA: NEGATIVE
Mucus, UA: 0
Nitrite, UA: NEGATIVE
Protein, UA: NEGATIVE
RBC: 2 M/uL — AB (ref 4.04–5.48)
Spec Grav, UA: 1.015 (ref 1.010–1.025)
Urobilinogen, UA: 0.2 E.U./dL
WBC Casts, UA: 5
pH, UA: 6 (ref 5.0–8.0)

## 2021-08-20 MED ORDER — NITROFURANTOIN MONOHYD MACRO 100 MG PO CAPS: 100.0000 mg | ORAL_CAPSULE | Freq: Two times a day (BID) | ORAL | 0 refills | Status: AC

## 2021-08-20 NOTE — Progress Notes (Signed)
Date:  08/20/2021   Name:  Katherine Adkins   DOB:  1976-09-10   MRN:  [id number]   Chief Complaint: Abdominal Pain (Bloated stomach. Abdominal pan for 2 days. Patient ate Tacos for dinner 2 nights ago. Went to bed and pain woke up her in the middle of the night. Lower mid abdominal pain that wraps around into back. Feels odd feeling when urinating. Uncomfortable to sit. Pain is better when laying down than when sitting. Right side is worse than left. Vomited swice during the first night. Belching a lot. )  HPI  Lab Results  Component Value Date   CREATININE 0.77 09/14/2019   BUN 9 09/14/2019   NA 137 09/14/2019   K 4.5 09/14/2019   CL 103 09/14/2019   CO2 22 09/14/2019   Lab Results  Component Value Date   CHOL 119 05/20/2016   HDL 55 05/20/2016   LDLCALC 48 05/20/2016   TRIG 82 05/20/2016   CHOLHDL 2.2 05/20/2016   Lab Results  Component Value Date   TSH 2.780 09/14/2019   No results found for: HGBA1C Lab Results  Component Value Date   WBC 5.2 09/14/2019   HGB 12.7 09/14/2019   HCT 38.6 09/14/2019   MCV 84 09/14/2019   PLT 331 09/14/2019   Lab Results  Component Value Date   ALT 28 09/14/2019   AST 20 09/14/2019   ALKPHOS 87 09/14/2019   BILITOT 0.3 09/14/2019     Review of Systems  Constitutional:  Negative for chills, fatigue and fever.  Respiratory:  Negative for chest tightness and shortness of breath.   Cardiovascular:  Negative for chest pain.  Gastrointestinal:  Positive for abdominal pain. Negative for constipation and diarrhea.  Genitourinary:  Positive for frequency and urgency. Negative for dysuria and hematuria.  Musculoskeletal:  Positive for back pain.  Psychiatric/Behavioral:  Negative for sleep disturbance.    Patient Active Problem List   Diagnosis Date Noted   Mild intermittent asthma without complication 31/51/7616   Personal history of colonic polyps    Benign neoplasm of descending colon    Elevated blood pressure,  situational 11/05/2015   History of colon polyps 11/05/2015   Cannot sleep 11/05/2015   Low back strain 11/05/2015   Snores 11/05/2015    No Known Allergies  Past Surgical History:  Procedure Laterality Date   CESAREAN SECTION  2004, 2007   COLONOSCOPY  2007   [place] - single benign polyp   COLONOSCOPY WITH PROPOFOL N/A 05/31/2016   Procedure: COLONOSCOPY WITH PROPOFOL;  Surgeon: Lucilla Lame, MD;  Location: [place];  Service: Endoscopy;  Laterality: N/A;   POLYPECTOMY  05/31/2016   Procedure: POLYPECTOMY;  Surgeon: Lucilla Lame, MD;  Location: [place];  Service: Endoscopy;;    Social History   Tobacco Use   Smoking status: Never   Smokeless tobacco: Never  Substance Use Topics   Alcohol use: No    Alcohol/week: 0.0 standard drinks    Comment: 1 glass of wine per month   Drug use: No     Medication list has been reviewed and updated.  Current Meds  Medication Sig   albuterol (PROAIR HFA) 108 (90 Base) MCG/ACT inhaler Inhale 2 puffs into the lungs every 4 (four) hours as needed for wheezing or shortness of breath.   budesonide-formoterol (SYMBICORT) 80-4.5 MCG/ACT inhaler Inhale 2 puffs into the lungs in the morning and at bedtime.   montelukast (SINGULAIR) 10 MG tablet TAKE 1 TABLET BY MOUTH  EVERYDAY AT BEDTIME   Multiple Vitamins-Minerals (MULTIVITAMIN WITH MINERALS) tablet Take 1 tablet by mouth daily.   nitrofurantoin, macrocrystal-monohydrate, (MACROBID) 100 MG capsule Take 1 capsule (100 mg total) by mouth 2 (two) times daily for 7 days.   omeprazole (PRILOSEC) 20 MG capsule Take 1 capsule (20 mg total) by mouth daily.   triamcinolone (KENALOG) 0.025 % cream Apply topically 2 (two) times daily.    PHQ 2/9 Scores 08/20/2021 09/14/2019 02/23/2019 12/20/2018  PHQ - 2 Score 0 2 1 0  PHQ- 9 Score 0 6 - -    GAD 7 : Generalized Anxiety Score 08/20/2021 09/14/2019  Nervous, Anxious, on Edge 0 3  Control/stop worrying 0 0  Worry too much -  different things 0 3  Trouble relaxing 0 2  Restless 0 0  Easily annoyed or irritable 0 2  Afraid - awful might happen 0 0  Total GAD 7 Score 0 10  Anxiety Difficulty Not difficult at all Somewhat difficult    BP Readings from Last 3 Encounters:  08/20/21 132/80  08/27/20 132/86  09/14/19 (!) 138/92    Physical Exam Vitals and nursing note reviewed.  Constitutional:      General: She is not in acute distress.    Appearance: She is well-developed.  HENT:     Head: Normocephalic and atraumatic.  Cardiovascular:     Rate and Rhythm: Normal rate and regular rhythm.  Pulmonary:     Effort: Pulmonary effort is normal. No respiratory distress.     Breath sounds: No wheezing or rhonchi.  Abdominal:     General: Abdomen is protuberant. Bowel sounds are normal.     Palpations: Abdomen is soft.     Tenderness: There is abdominal tenderness in the suprapubic area. There is no right CVA tenderness or left CVA tenderness.  Skin:    General: Skin is warm and dry.     Findings: No rash.  Neurological:     Mental Status: She is alert and oriented to person, place, and time.  Psychiatric:        Mood and Affect: Mood normal.        Behavior: Behavior normal.    Wt Readings from Last 3 Encounters:  08/20/21 261 lb 12.8 oz (118.8 kg)  08/27/20 261 lb (118.4 kg)  09/14/19 251 lb (113.9 kg)    BP 132/80   Pulse 85   Temp 98.7 F (37.1 C) (Oral)   Ht 5\' 7"  (1.702 m)   Wt 261 lb 12.8 oz (118.8 kg)   SpO2 96%   BMI 41.00 kg/m   Assessment and Plan: 1. Acute cystitis with hematuria Continue to push fluids, use cranberry tabs Add AZO for bladder discomfort  Follow up if no improvement. - nitrofurantoin, macrocrystal-monohydrate, (MACROBID) 100 MG capsule; Take 1 capsule (100 mg total) by mouth 2 (two) times daily for 7 days.  Dispense: 14 capsule; Refill: 0   Partially dictated using Editor, commissioning. Any errors are unintentional.  Halina Maidens, MD [facility]  08/20/2021

## 2021-09-02 DIAGNOSIS — L821 Other seborrheic keratosis: Secondary | ICD-10-CM | POA: Diagnosis not present

## 2021-09-03 ENCOUNTER — Other Ambulatory Visit: Payer: Self-pay | Admitting: Internal Medicine

## 2021-09-03 DIAGNOSIS — J4541 Moderate persistent asthma with (acute) exacerbation: Secondary | ICD-10-CM

## 2021-09-03 NOTE — Telephone Encounter (Signed)
Requested Prescriptions  Pending Prescriptions Disp Refills  . PROAIR HFA 108 (90 Base) MCG/ACT inhaler [Pharmacy Med Name: PROAIR HFA 90 MCG INHALER] 8.5 each 2    Sig: INHALE 2 PUFFS INTO THE LUNGS EVERY 4 HOURS AS NEEDED FOR WHEEZE OR FOR SHORTNESS OF BREATH     Pulmonology:  Beta Agonists Failed - 09/03/2021  3:49 AM      Failed - One inhaler should last at least one month. If the patient is requesting refills earlier, contact the patient to check for uncontrolled symptoms.      Passed - Valid encounter within last 12 months    Recent Outpatient Visits          2 weeks ago Acute cystitis with hematuria   [facility] Glean Hess, MD   1 year ago Mild persistent asthma without complication   [facility] Glean Hess, MD   1 year ago Mild intermittent asthma without complication   [facility] Glean Hess, MD   2 years ago Strain of lumbar region, initial encounter   [facility] Glean Hess, MD   2 years ago Acute non-recurrent maxillary sinusitis   [facility] Glean Hess, MD      Future Appointments            In 2 months Army Melia Jesse Sans, MD [facility], [facility]

## 2021-09-15 ENCOUNTER — Other Ambulatory Visit: Payer: Self-pay | Admitting: Internal Medicine

## 2021-09-15 DIAGNOSIS — J4530 Mild persistent asthma, uncomplicated: Secondary | ICD-10-CM

## 2021-09-15 NOTE — Telephone Encounter (Signed)
Requested Prescriptions  Pending Prescriptions Disp Refills  . montelukast (SINGULAIR) 10 MG tablet [Pharmacy Med Name: MONTELUKAST SOD 10 MG TABLET] 90 tablet 0    Sig: TAKE 1 TABLET BY MOUTH EVERYDAY AT BEDTIME     Pulmonology:  Leukotriene Inhibitors Passed - 09/15/2021  5:46 PM      Passed - Valid encounter within last 12 months    Recent Outpatient Visits          3 weeks ago Acute cystitis with hematuria   [facility] Glean Hess, MD   1 year ago Mild persistent asthma without complication   [facility] Glean Hess, MD   2 years ago Mild intermittent asthma without complication   [facility] Glean Hess, MD   2 years ago Strain of lumbar region, initial encounter   [facility] Glean Hess, MD   2 years ago Acute non-recurrent maxillary sinusitis   [facility] Glean Hess, MD      Future Appointments            In 1 month Army Melia, Jesse Sans, MD [facility], [facility]

## 2021-09-29 ENCOUNTER — Other Ambulatory Visit: Payer: Self-pay | Admitting: Internal Medicine

## 2021-09-29 DIAGNOSIS — J4530 Mild persistent asthma, uncomplicated: Secondary | ICD-10-CM

## 2021-10-19 DIAGNOSIS — R6889 Other general symptoms and signs: Secondary | ICD-10-CM | POA: Diagnosis not present

## 2021-10-29 ENCOUNTER — Other Ambulatory Visit: Payer: Self-pay | Admitting: Internal Medicine

## 2021-10-29 DIAGNOSIS — J4530 Mild persistent asthma, uncomplicated: Secondary | ICD-10-CM

## 2021-10-29 NOTE — Telephone Encounter (Signed)
Requested Prescriptions  Pending Prescriptions Disp Refills  . SYMBICORT 80-4.5 MCG/ACT inhaler [Pharmacy Med Name: SYMBICORT 80-4.5 MCG INHALER] 10.2 each 0    Sig: INHALE 2 PUFFS INTO THE LUNGS IN THE MORNING AND AT BEDTIME.     Pulmonology:  Combination Products Passed - 10/29/2021  3:57 AM      Passed - Valid encounter within last 12 months    Recent Outpatient Visits          2 months ago Acute cystitis with hematuria   [facility] Glean Hess, MD   1 year ago Mild persistent asthma without complication   [facility] Glean Hess, MD   2 years ago Mild intermittent asthma without complication   [facility] Glean Hess, MD   2 years ago Strain of lumbar region, initial encounter   [facility] Glean Hess, MD   2 years ago Acute non-recurrent maxillary sinusitis   [facility] Glean Hess, MD      Future Appointments            In 2 weeks Army Melia Jesse Sans, MD [facility], [facility]

## 2021-11-12 ENCOUNTER — Encounter: Payer: BC Managed Care – PPO | Admitting: Internal Medicine

## 2021-11-30 DIAGNOSIS — Z23 Encounter for immunization: Secondary | ICD-10-CM | POA: Diagnosis not present

## 2021-12-07 ENCOUNTER — Telehealth (INDEPENDENT_AMBULATORY_CARE_PROVIDER_SITE_OTHER): Payer: BC Managed Care – PPO | Admitting: Internal Medicine

## 2021-12-07 ENCOUNTER — Other Ambulatory Visit: Payer: Self-pay

## 2021-12-07 ENCOUNTER — Encounter: Payer: Self-pay | Admitting: Internal Medicine

## 2021-12-07 VITALS — Ht 67.0 in | Wt 261.8 lb

## 2021-12-07 DIAGNOSIS — J4541 Moderate persistent asthma with (acute) exacerbation: Secondary | ICD-10-CM

## 2021-12-07 DIAGNOSIS — U071 COVID-19: Secondary | ICD-10-CM

## 2021-12-07 MED ORDER — ALBUTEROL SULFATE HFA 108 (90 BASE) MCG/ACT IN AERS: INHALATION_SPRAY | RESPIRATORY_TRACT | 2 refills | Status: DC

## 2021-12-07 MED ORDER — MOLNUPIRAVIR EUA 200MG CAPSULE: 4.0000 | ORAL_CAPSULE | Freq: Two times a day (BID) | ORAL | 0 refills | Status: AC

## 2021-12-07 MED ORDER — PROMETHAZINE-DM 6.25-15 MG/5ML PO SYRP: 5.0000 mL | ORAL_SOLUTION | Freq: Four times a day (QID) | ORAL | 0 refills | Status: DC | PRN

## 2021-12-07 NOTE — Patient Instructions (Signed)
Take Tylenol 650 - 1000 mg every 6-8 hours for fever, body aches and headache. Drink plenty of fluids with electrolytes. Monitor for fever that does not decrease and/or shortness of breath that worsens or is present at rest.   

## 2021-12-07 NOTE — Progress Notes (Signed)
Date:  12/07/2021   Name:  Katherine Adkins   DOB:  Mar 25, 1976   MRN:  [id number]   Chief Complaint: Covid Positive  Virtual Visit via Video Note  I connected with Katherine Adkins on 12/07/21 at 11:20 AM EST by a video enabled telemedicine application and verified that I am speaking with the correct person using two identifiers.  Location: Patient: HOME Provider: OFFICE   I discussed the limitations of evaluation and management by telemedicine and the availability of in person appointments. The patient expressed understanding and agreed to proceed.  Cough This is a new problem. The current episode started yesterday. The cough is Productive of sputum. Associated symptoms include headaches, nasal congestion, rhinorrhea, a sore throat and shortness of breath. Pertinent negatives include no chills or fever.  Onset of sx yesterday.  Tested positive last night with home test. Husband is also sick - started three days ago. She is vaccinated with the original series.  Lab Results  Component Value Date   NA 137 09/14/2019   K 4.5 09/14/2019   CO2 22 09/14/2019   GLUCOSE 82 09/14/2019   BUN 9 09/14/2019   CREATININE 0.77 09/14/2019   CALCIUM 9.6 09/14/2019   GFRNONAA 94 09/14/2019   Lab Results  Component Value Date   CHOL 119 05/20/2016   HDL 55 05/20/2016   LDLCALC 48 05/20/2016   TRIG 82 05/20/2016   CHOLHDL 2.2 05/20/2016   Lab Results  Component Value Date   TSH 2.780 09/14/2019   No results found for: HGBA1C Lab Results  Component Value Date   WBC 5.2 09/14/2019   HGB 12.7 09/14/2019   HCT 38.6 09/14/2019   MCV 84 09/14/2019   PLT 331 09/14/2019   Lab Results  Component Value Date   ALT 28 09/14/2019   AST 20 09/14/2019   ALKPHOS 87 09/14/2019   BILITOT 0.3 09/14/2019   No results found for: 25OHVITD2, 25OHVITD3, VD25OH   Review of Systems  Constitutional:  Positive for fatigue. Negative for chills and fever.  HENT:  Positive for rhinorrhea  and sore throat. Negative for trouble swallowing.   Respiratory:  Positive for cough and shortness of breath.   Gastrointestinal:  Negative for diarrhea, nausea and vomiting.  Neurological:  Positive for headaches. Negative for dizziness and light-headedness.   Patient Active Problem List   Diagnosis Date Noted   Mild intermittent asthma without complication 11/94/1740   Personal history of colonic polyps    Benign neoplasm of descending colon    Elevated blood pressure, situational 11/05/2015   History of colon polyps 11/05/2015   Cannot sleep 11/05/2015   Low back strain 11/05/2015   Snores 11/05/2015    No Known Allergies  Past Surgical History:  Procedure Laterality Date   CESAREAN SECTION  2004, 2007   COLONOSCOPY  2007   [place] - single benign polyp   COLONOSCOPY WITH PROPOFOL N/A 05/31/2016   Procedure: COLONOSCOPY WITH PROPOFOL;  Surgeon: Lucilla Lame, MD;  Location: [place];  Service: Endoscopy;  Laterality: N/A;   POLYPECTOMY  05/31/2016   Procedure: POLYPECTOMY;  Surgeon: Lucilla Lame, MD;  Location: [place];  Service: Endoscopy;;    Social History   Tobacco Use   Smoking status: Never   Smokeless tobacco: Never  Substance Use Topics   Alcohol use: No    Alcohol/week: 0.0 standard drinks    Comment: 1 glass of wine per month   Drug use: No     Medication  list has been reviewed and updated.  Current Meds  Medication Sig   molnupiravir EUA (LAGEVRIO) 200 mg CAPS capsule Take 4 capsules (800 mg total) by mouth 2 (two) times daily for 5 days.   montelukast (SINGULAIR) 10 MG tablet TAKE 1 TABLET BY MOUTH EVERYDAY AT BEDTIME   Multiple Vitamins-Minerals (MULTIVITAMIN WITH MINERALS) tablet Take 1 tablet by mouth daily.   omeprazole (PRILOSEC) 20 MG capsule Take 1 capsule (20 mg total) by mouth daily.   promethazine-dextromethorphan (PROMETHAZINE-DM) 6.25-15 MG/5ML syrup Take 5 mLs by mouth 4 (four) times daily as needed for cough.    SYMBICORT 80-4.5 MCG/ACT inhaler INHALE 2 PUFFS INTO THE LUNGS IN THE MORNING AND AT BEDTIME.   triamcinolone (KENALOG) 0.025 % cream Apply topically 2 (two) times daily.   [DISCONTINUED] PROAIR HFA 108 (90 Base) MCG/ACT inhaler INHALE 2 PUFFS INTO THE LUNGS EVERY 4 HOURS AS NEEDED FOR WHEEZE OR FOR SHORTNESS OF BREATH    PHQ 2/9 Scores 12/07/2021 08/20/2021 09/14/2019 02/23/2019  PHQ - 2 Score 0 0 2 1  PHQ- 9 Score 0 0 6 -    GAD 7 : Generalized Anxiety Score 12/07/2021 08/20/2021 09/14/2019  Nervous, Anxious, on Edge 0 0 3  Control/stop worrying 0 0 0  Worry too much - different things 0 0 3  Trouble relaxing 0 0 2  Restless 0 0 0  Easily annoyed or irritable 0 0 2  Afraid - awful might happen 0 0 0  Total GAD 7 Score 0 0 10  Anxiety Difficulty Not difficult at all Not difficult at all Somewhat difficult    BP Readings from Last 3 Encounters:  08/20/21 132/80  08/27/20 132/86  09/14/19 (!) 138/92    Physical Exam Vitals and nursing note reviewed.  Constitutional:      General: She is not in acute distress.    Appearance: Normal appearance. She is well-developed. She is not ill-appearing.  Pulmonary:     Effort: Pulmonary effort is normal. No respiratory distress.  Skin:    Findings: No rash.  Neurological:     Mental Status: She is alert and oriented to person, place, and time.  Psychiatric:        Mood and Affect: Mood normal.        Behavior: Behavior normal.    Wt Readings from Last 3 Encounters:  12/07/21 261 lb 12.8 oz (118.8 kg)  08/20/21 261 lb 12.8 oz (118.8 kg)  08/27/20 261 lb (118.4 kg)    Ht 5\' 7"  (1.702 m)    Wt 261 lb 12.8 oz (118.8 kg)    BMI 41.00 kg/m   Assessment and Plan: 1. COVID-19 virus infection Take Tylenol 650 - 1000 mg every 6-8 hours for fever, body aches and headache. Drink plenty of fluids with electrolytes. Monitor for fever that does not decrease and/or shortness of breath that worsens or is present at rest. - molnupiravir EUA  (LAGEVRIO) 200 mg CAPS capsule; Take 4 capsules (800 mg total) by mouth 2 (two) times daily for 5 days.  Dispense: 40 capsule; Refill: 0 - promethazine-dextromethorphan (PROMETHAZINE-DM) 6.25-15 MG/5ML syrup; Take 5 mLs by mouth 4 (four) times daily as needed for cough.  Dispense: 118 mL; Refill: 0  2. Moderate persistent asthma with acute exacerbation in adult - albuterol (PROAIR HFA) 108 (90 Base) MCG/ACT inhaler; INHALE 2 PUFFS INTO THE LUNGS EVERY 4 HOURS AS NEEDED FOR WHEEZE OR FOR SHORTNESS OF BREATH  Dispense: 8.5 each; Refill: 2  I spent 9 minutes on  this encounter, 99% by video encounter. Partially dictated using Editor, commissioning. Any errors are unintentional.  Halina Maidens, MD [facility]  12/07/2021

## 2021-12-11 ENCOUNTER — Other Ambulatory Visit: Payer: Self-pay | Admitting: Internal Medicine

## 2021-12-11 ENCOUNTER — Telehealth: Payer: Self-pay | Admitting: Internal Medicine

## 2021-12-11 ENCOUNTER — Ambulatory Visit: Payer: Self-pay | Admitting: *Deleted

## 2021-12-11 ENCOUNTER — Encounter: Payer: Self-pay | Admitting: Internal Medicine

## 2021-12-11 DIAGNOSIS — B009 Herpesviral infection, unspecified: Secondary | ICD-10-CM

## 2021-12-11 MED ORDER — VALACYCLOVIR HCL 1 G PO TABS: 1000.0000 mg | ORAL_TABLET | Freq: Two times a day (BID) | ORAL | 0 refills | Status: AC

## 2021-12-11 NOTE — Telephone Encounter (Signed)
Spoke to pt told her that the symptoms she is having is not a common side effect that we see our patients have. Told pt to take a picture and send it to use on mychart. Pt verbalized understanding.  KP

## 2021-12-11 NOTE — Telephone Encounter (Signed)
Patient notified prescription for Valtrex was sent to her pharmacy and verbalized understanding.  No further questions at this time.

## 2021-12-11 NOTE — Telephone Encounter (Signed)
Please review.  KP

## 2021-12-11 NOTE — Telephone Encounter (Signed)
2nd attempt to contact patient to review symptoms of blister on lip / gum. No answer , LVMTCB [phone].

## 2021-12-11 NOTE — Telephone Encounter (Signed)
I returned pt's call.  She called in earlier today c/o having a blister on her top lip and gum after taking molnupiravir for Covid this past week.  She also has bumps on the roof of her mouth.   Is this a side effect of the medication?  How is it treated?  I left a voicemail for her call back.

## 2021-12-11 NOTE — Telephone Encounter (Signed)
3rd attempt- Pt called, left VM to call office back to discuss symptoms with a nurse. Unable to reach patient after 3 attempts by [facility] NT, routing to the provider for resolution per protocol.

## 2021-12-11 NOTE — Telephone Encounter (Signed)
Copied from [place] [id number]. Topic: General - Other >> Dec 11, 2021  4:46 PM Tessa Lerner A wrote: Reason for CRM: The patient has returned a missed call from the practice  Please contact further when possible

## 2021-12-11 NOTE — Telephone Encounter (Signed)
Patient viewed message from Dr. Army Melia via [place].

## 2021-12-22 ENCOUNTER — Encounter: Payer: BC Managed Care – PPO | Admitting: Internal Medicine

## 2021-12-28 ENCOUNTER — Other Ambulatory Visit: Payer: Self-pay | Admitting: Internal Medicine

## 2021-12-28 DIAGNOSIS — J4530 Mild persistent asthma, uncomplicated: Secondary | ICD-10-CM

## 2021-12-28 NOTE — Telephone Encounter (Signed)
Requested Prescriptions  Pending Prescriptions Disp Refills   montelukast (SINGULAIR) 10 MG tablet [Pharmacy Med Name: MONTELUKAST SOD 10 MG TABLET] 90 tablet 0    Sig: TAKE 1 TABLET BY MOUTH EVERYDAY AT BEDTIME     Pulmonology:  Leukotriene Inhibitors Passed - 12/28/2021  1:40 AM      Passed - Valid encounter within last 12 months    Recent Outpatient Visits          3 weeks ago COVID-19 virus infection   [facility] Glean Hess, MD   4 months ago Acute cystitis with hematuria   [facility] Glean Hess, MD   1 year ago Mild persistent asthma without complication   [facility] Glean Hess, MD   2 years ago Mild intermittent asthma without complication   [facility] Glean Hess, MD   2 years ago Strain of lumbar region, initial encounter   [facility] Glean Hess, MD      Future Appointments            In 1 week Army Melia Jesse Sans, MD [facility], [facility]

## 2022-01-08 ENCOUNTER — Ambulatory Visit (INDEPENDENT_AMBULATORY_CARE_PROVIDER_SITE_OTHER): Payer: BC Managed Care – PPO | Admitting: Internal Medicine

## 2022-01-08 ENCOUNTER — Other Ambulatory Visit: Payer: Self-pay

## 2022-01-08 ENCOUNTER — Encounter: Payer: Self-pay | Admitting: Internal Medicine

## 2022-01-08 VITALS — BP 136/78 | HR 89 | Ht 67.0 in | Wt 256.2 lb

## 2022-01-08 DIAGNOSIS — Z0000 Encounter for general adult medical examination without abnormal findings: Secondary | ICD-10-CM

## 2022-01-08 DIAGNOSIS — Z1211 Encounter for screening for malignant neoplasm of colon: Secondary | ICD-10-CM

## 2022-01-08 DIAGNOSIS — Z1231 Encounter for screening mammogram for malignant neoplasm of breast: Secondary | ICD-10-CM

## 2022-01-08 DIAGNOSIS — Z1159 Encounter for screening for other viral diseases: Secondary | ICD-10-CM

## 2022-01-08 DIAGNOSIS — J4520 Mild intermittent asthma, uncomplicated: Secondary | ICD-10-CM

## 2022-01-08 MED ORDER — BUDESONIDE-FORMOTEROL FUMARATE 80-4.5 MCG/ACT IN AERO: 2.0000 | INHALATION_SPRAY | Freq: Two times a day (BID) | RESPIRATORY_TRACT | 5 refills | Status: DC

## 2022-01-08 NOTE — Progress Notes (Signed)
Date:  01/08/2022   Name:  Katherine Adkins   DOB:  09-06-1976   MRN:  [id number]   Chief Complaint: Annual Exam Conni Best R[place] is a 46 y.o. female who presents today for her Complete Annual Exam. She feels well. She reports using resistant bands at work. She reports she is sleeping fairly well. Breast complaints - none.  Mammogram: 09/2020 @ [facility] DEXA: none Pap smear: 08/2018 negative with co-testing Colonoscopy: 05/2016 Hyperplastic polyp -repeat 5 yrs.  Immunization History  Administered Date(s) Administered   Influenza,inj,Quad PF,6+ Mos 12/20/2018, 09/14/2019, 08/27/2020   Influenza-Unspecified 09/22/2021   Moderna Sars-Covid-2 Vaccination 03/22/2020, 04/22/2020    Asthma She complains of cough and wheezing. There is no shortness of breath. This is a recurrent problem. The problem occurs intermittently. Pertinent negatives include no chest pain, fever, headaches or trouble swallowing. Her symptoms are alleviated by beta-agonist, steroid inhaler and leukotriene antagonist. Her past medical history is significant for asthma.   Lab Results  Component Value Date   NA 137 09/14/2019   K 4.5 09/14/2019   CO2 22 09/14/2019   GLUCOSE 82 09/14/2019   BUN 9 09/14/2019   CREATININE 0.77 09/14/2019   CALCIUM 9.6 09/14/2019   GFRNONAA 94 09/14/2019   Lab Results  Component Value Date   CHOL 119 05/20/2016   HDL 55 05/20/2016   LDLCALC 48 05/20/2016   TRIG 82 05/20/2016   CHOLHDL 2.2 05/20/2016   Lab Results  Component Value Date   TSH 2.780 09/14/2019   No results found for: HGBA1C Lab Results  Component Value Date   WBC 5.2 09/14/2019   HGB 12.7 09/14/2019   HCT 38.6 09/14/2019   MCV 84 09/14/2019   PLT 331 09/14/2019   Lab Results  Component Value Date   ALT 28 09/14/2019   AST 20 09/14/2019   ALKPHOS 87 09/14/2019   BILITOT 0.3 09/14/2019   No results found for: 25OHVITD2, 25OHVITD3, VD25OH   Review of Systems  Constitutional:  Negative for  chills, fatigue and fever.  HENT:  Negative for congestion, hearing loss, tinnitus, trouble swallowing and voice change.   Eyes:  Negative for visual disturbance.  Respiratory:  Positive for cough and wheezing. Negative for chest tightness and shortness of breath.   Cardiovascular:  Negative for chest pain, palpitations and leg swelling.  Gastrointestinal:  Negative for abdominal pain, constipation, diarrhea and vomiting.  Endocrine: Negative for polydipsia and polyuria.  Genitourinary:  Negative for dysuria, frequency, genital sores, vaginal bleeding and vaginal discharge.  Musculoskeletal:  Negative for arthralgias, gait problem and joint swelling.  Skin:  Negative for color change and rash.  Neurological:  Negative for dizziness, tremors, light-headedness and headaches.  Hematological:  Negative for adenopathy. Does not bruise/bleed easily.  Psychiatric/Behavioral:  Negative for dysphoric mood and sleep disturbance. The patient is not nervous/anxious.    Patient Active Problem List   Diagnosis Date Noted   Mild intermittent asthma without complication 05/15/7627   Personal history of colonic polyps    Benign neoplasm of descending colon    Elevated blood pressure, situational 11/05/2015   History of colon polyps 11/05/2015   Cannot sleep 11/05/2015   Low back strain 11/05/2015   Snores 11/05/2015    No Known Allergies  Past Surgical History:  Procedure Laterality Date   CESAREAN SECTION  2004, 2007   COLONOSCOPY  2007   Spanarkel - single benign polyp   COLONOSCOPY WITH PROPOFOL N/A 05/31/2016   Procedure: COLONOSCOPY WITH PROPOFOL;  Surgeon:  Lucilla Lame, MD;  Location: [place];  Service: Endoscopy;  Laterality: N/A;   POLYPECTOMY  05/31/2016   Procedure: POLYPECTOMY;  Surgeon: Lucilla Lame, MD;  Location: [place];  Service: Endoscopy;;    Social History   Tobacco Use   Smoking status: Never   Smokeless tobacco: Never  Substance Use Topics    Alcohol use: No    Alcohol/week: 0.0 standard drinks    Comment: 1 glass of wine per month   Drug use: No     Medication list has been reviewed and updated.  No outpatient medications have been marked as taking for the 01/08/22 encounter (Office Visit) with Glean Hess, MD.    [facility] 2/9 Scores 01/08/2022 12/07/2021 08/20/2021 09/14/2019  PHQ - 2 Score 0 0 0 2  PHQ- 9 Score 1 0 0 6    GAD 7 : Generalized Anxiety Score 01/08/2022 12/07/2021 08/20/2021 09/14/2019  Nervous, Anxious, on Edge 0 0 0 3  Control/stop worrying 0 0 0 0  Worry too much - different things 0 0 0 3  Trouble relaxing 0 0 0 2  Restless 0 0 0 0  Easily annoyed or irritable 0 0 0 2  Afraid - awful might happen 0 0 0 0  Total GAD 7 Score 0 0 0 10  Anxiety Difficulty Not difficult at all Not difficult at all Not difficult at all Somewhat difficult    BP Readings from Last 3 Encounters:  01/08/22 136/78  08/20/21 132/80  08/27/20 132/86    Physical Exam Vitals and nursing note reviewed.  Constitutional:      General: She is not in acute distress.    Appearance: She is well-developed.  HENT:     Head: Normocephalic and atraumatic.     Right Ear: Tympanic membrane and ear canal normal.     Left Ear: Tympanic membrane and ear canal normal.     Nose:     Right Sinus: No maxillary sinus tenderness.     Left Sinus: No maxillary sinus tenderness.  Eyes:     General: No scleral icterus.       Right eye: No discharge.        Left eye: No discharge.     Conjunctiva/sclera: Conjunctivae normal.  Neck:     Thyroid: No thyromegaly.     Vascular: No carotid bruit.  Cardiovascular:     Rate and Rhythm: Normal rate and regular rhythm.     Pulses: Normal pulses.     Heart sounds: Normal heart sounds.  Pulmonary:     Effort: Pulmonary effort is normal. No respiratory distress.     Breath sounds: No wheezing.  Abdominal:     General: Bowel sounds are normal.     Palpations: Abdomen is soft.     Tenderness: There  is no abdominal tenderness.  Musculoskeletal:     Cervical back: Normal range of motion. No erythema.     Right lower leg: No edema.     Left lower leg: No edema.  Lymphadenopathy:     Cervical: No cervical adenopathy.  Skin:    General: Skin is warm and dry.     Capillary Refill: Capillary refill takes less than 2 seconds.     Findings: No rash.  Neurological:     General: No focal deficit present.     Mental Status: She is alert and oriented to person, place, and time.     Cranial Nerves: No cranial nerve deficit.  Sensory: No sensory deficit.     Deep Tendon Reflexes: Reflexes are normal and symmetric.  Psychiatric:        Attention and Perception: Attention normal.        Mood and Affect: Mood normal.    Wt Readings from Last 3 Encounters:  01/08/22 256 lb 3.2 oz (116.2 kg)  12/07/21 261 lb 12.8 oz (118.8 kg)  08/20/21 261 lb 12.8 oz (118.8 kg)    BP 136/78    Pulse 89    Ht 5\' 7"  (1.702 m)    Wt 256 lb 3.2 oz (116.2 kg)    SpO2 98%    BMI 40.13 kg/m   Assessment and Plan: 1. Annual physical exam Exam is normal except for weight. Encourage regular exercise and appropriate dietary changes. - Comprehensive metabolic panel - Lipid panel - TSH - Hemoglobin A1c  2. Encounter for screening mammogram for breast cancer Overdue for mammogram - she will contact her GYN at [facility] for scheduling.  3. Colon cancer screening Due for 5 yr colonoscopy - she wants to be referred to [place] - Ambulatory referral to Gastroenterology  4. Mild intermittent asthma without complication Doing well off of daily symbicort. Oral lesions have resolved.  She will continue rinse thoroughly after using steroid inhaler. She should consider Prevnar-20 vaccinations - budesonide-formoterol (SYMBICORT) 80-4.5 MCG/ACT inhaler; Inhale 2 puffs into the lungs 2 (two) times daily. in the morning and at bedtime.  Dispense: 10.2 each; Refill: 5 - CBC with Differential/Platelet  5. Need for  hepatitis C screening test - Hepatitis C antibody    Partially dictated using Editor, commissioning. Any errors are unintentional.  Halina Maidens, MD [facility]  01/08/2022

## 2022-01-08 NOTE — Patient Instructions (Signed)
Consider getting Prevnar-20 pneumonia vaccine

## 2022-01-09 LAB — CBC WITH DIFFERENTIAL/PLATELET
Basophils Absolute: 0 10*3/uL (ref 0.0–0.2)
Basos: 1 %
EOS (ABSOLUTE): 0.1 10*3/uL (ref 0.0–0.4)
Eos: 3 %
Hematocrit: 40.2 % (ref 34.0–46.6)
Hemoglobin: 12.8 g/dL (ref 11.1–15.9)
Immature Grans (Abs): 0 10*3/uL (ref 0.0–0.1)
Immature Granulocytes: 0 %
Lymphocytes Absolute: 1.5 10*3/uL (ref 0.7–3.1)
Lymphs: 34 %
MCH: 28 pg (ref 26.6–33.0)
MCHC: 31.8 g/dL (ref 31.5–35.7)
MCV: 88 fL (ref 79–97)
Monocytes Absolute: 0.6 10*3/uL (ref 0.1–0.9)
Monocytes: 14 %
Neutrophils Absolute: 2.2 10*3/uL (ref 1.4–7.0)
Neutrophils: 48 %
Platelets: 287 10*3/uL (ref 150–450)
RBC: 4.57 x10E6/uL (ref 3.77–5.28)
RDW: 13.3 % (ref 11.7–15.4)
WBC: 4.5 10*3/uL (ref 3.4–10.8)

## 2022-01-09 LAB — COMPREHENSIVE METABOLIC PANEL
ALT: 20 IU/L (ref 0–32)
AST: 16 IU/L (ref 0–40)
Albumin/Globulin Ratio: 1.4 (ref 1.2–2.2)
Albumin: 4.2 g/dL (ref 3.8–4.8)
Alkaline Phosphatase: 88 IU/L (ref 44–121)
BUN/Creatinine Ratio: 12 (ref 9–23)
BUN: 11 mg/dL (ref 6–24)
Bilirubin Total: 0.5 mg/dL (ref 0.0–1.2)
CO2: 23 mmol/L (ref 20–29)
Calcium: 9.4 mg/dL (ref 8.7–10.2)
Chloride: 102 mmol/L (ref 96–106)
Creatinine, Ser: 0.89 mg/dL (ref 0.57–1.00)
Globulin, Total: 3.1 g/dL (ref 1.5–4.5)
Glucose: 106 mg/dL — ABNORMAL HIGH (ref 70–99)
Potassium: 4.2 mmol/L (ref 3.5–5.2)
Sodium: 138 mmol/L (ref 134–144)
Total Protein: 7.3 g/dL (ref 6.0–8.5)
eGFR: 81 mL/min/{1.73_m2} (ref >59)

## 2022-01-09 LAB — LIPID PANEL
Chol/HDL Ratio: 2.1 ratio (ref 0.0–4.4)
Cholesterol, Total: 133 mg/dL (ref 100–199)
HDL: 62 mg/dL (ref >39)
LDL Chol Calc (NIH): 57 mg/dL (ref 0–99)
Triglycerides: 67 mg/dL (ref 0–149)
VLDL Cholesterol Cal: 14 mg/dL (ref 5–40)

## 2022-01-09 LAB — HEMOGLOBIN A1C
Est. average glucose Bld gHb Est-mCnc: 111 mg/dL
Hgb A1c MFr Bld: 5.5 % (ref 4.8–5.6)

## 2022-01-09 LAB — HEPATITIS C ANTIBODY: Hep C Virus Ab: NONREACTIVE

## 2022-01-09 LAB — TSH: TSH: 2.2 u[IU]/mL (ref 0.450–4.500)

## 2022-01-18 ENCOUNTER — Other Ambulatory Visit: Payer: Self-pay | Admitting: Internal Medicine

## 2022-01-18 DIAGNOSIS — Z1231 Encounter for screening mammogram for malignant neoplasm of breast: Secondary | ICD-10-CM

## 2022-01-25 DIAGNOSIS — L821 Other seborrheic keratosis: Secondary | ICD-10-CM | POA: Diagnosis not present

## 2022-01-25 DIAGNOSIS — G548 Other nerve root and plexus disorders: Secondary | ICD-10-CM | POA: Diagnosis not present

## 2022-01-25 DIAGNOSIS — L578 Other skin changes due to chronic exposure to nonionizing radiation: Secondary | ICD-10-CM | POA: Diagnosis not present

## 2022-02-01 DIAGNOSIS — Z1212 Encounter for screening for malignant neoplasm of rectum: Secondary | ICD-10-CM | POA: Diagnosis not present

## 2022-02-01 DIAGNOSIS — Z1211 Encounter for screening for malignant neoplasm of colon: Secondary | ICD-10-CM | POA: Diagnosis not present

## 2022-02-01 DIAGNOSIS — Z86010 Personal history of colonic polyps: Secondary | ICD-10-CM | POA: Diagnosis not present

## 2022-02-18 ENCOUNTER — Encounter: Payer: Self-pay | Admitting: Internal Medicine

## 2022-02-18 DIAGNOSIS — Z09 Encounter for follow-up examination after completed treatment for conditions other than malignant neoplasm: Secondary | ICD-10-CM | POA: Diagnosis not present

## 2022-02-18 DIAGNOSIS — Z1211 Encounter for screening for malignant neoplasm of colon: Secondary | ICD-10-CM | POA: Diagnosis not present

## 2022-02-18 DIAGNOSIS — Z86010 Personal history of colonic polyps: Secondary | ICD-10-CM | POA: Diagnosis not present

## 2022-02-18 DIAGNOSIS — Z1212 Encounter for screening for malignant neoplasm of rectum: Secondary | ICD-10-CM | POA: Diagnosis not present

## 2022-02-18 DIAGNOSIS — Z79899 Other long term (current) drug therapy: Secondary | ICD-10-CM | POA: Diagnosis not present

## 2022-02-18 DIAGNOSIS — J45909 Unspecified asthma, uncomplicated: Secondary | ICD-10-CM | POA: Diagnosis not present

## 2022-02-18 LAB — HM COLONOSCOPY

## 2022-03-10 ENCOUNTER — Ambulatory Visit
Admission: RE | Admit: 2022-03-10 | Discharge: 2022-03-10 | Disposition: A | Discharge status: Home / Self Care | Payer: BC Managed Care – PPO | Source: Ambulatory Visit | Attending: Internal Medicine | Admitting: Internal Medicine

## 2022-03-10 DIAGNOSIS — Z1231 Encounter for screening mammogram for malignant neoplasm of breast: Secondary | ICD-10-CM | POA: Insufficient documentation

## 2022-03-11 ENCOUNTER — Inpatient Hospital Stay
Admission: RE | Admit: 2022-03-11 | Discharge: 2022-03-11 | Disposition: A | Discharge status: Home / Self Care | Payer: Self-pay | Source: Ambulatory Visit | Attending: *Deleted | Admitting: *Deleted

## 2022-03-11 ENCOUNTER — Other Ambulatory Visit: Payer: Self-pay | Admitting: *Deleted

## 2022-03-11 DIAGNOSIS — Z1231 Encounter for screening mammogram for malignant neoplasm of breast: Secondary | ICD-10-CM

## 2022-04-26 DIAGNOSIS — M25562 Pain in left knee: Secondary | ICD-10-CM | POA: Diagnosis not present

## 2022-07-26 ENCOUNTER — Other Ambulatory Visit: Payer: Self-pay | Admitting: Internal Medicine

## 2022-07-26 DIAGNOSIS — J4530 Mild persistent asthma, uncomplicated: Secondary | ICD-10-CM

## 2022-07-28 NOTE — Telephone Encounter (Signed)
Requested Prescriptions  Pending Prescriptions Disp Refills  . montelukast (SINGULAIR) 10 MG tablet [Pharmacy Med Name: MONTELUKAST SOD 10 MG TABLET] 90 tablet 1    Sig: TAKE 1 TABLET BY MOUTH EVERYDAY AT BEDTIME     Pulmonology:  Leukotriene Inhibitors Passed - 07/26/2022  2:27 AM      Passed - Valid encounter within last 12 months    Recent Outpatient Visits          6 months ago Annual physical exam   [place] [facility] at [facility], Jesse Sans, MD   7 months ago COVID-19 virus infection   [place] [facility] at [facility], Jesse Sans, MD   11 months ago Acute cystitis with hematuria   [place] [facility] at [facility], Jesse Sans, MD   1 year ago Mild persistent asthma without complication   [place] [facility] at [facility], Jesse Sans, MD   2 years ago Mild intermittent asthma without complication   [place] [facility] at [facility], Jesse Sans, MD      Future Appointments            In 5 months Army Melia, Jesse Sans, MD [place] [facility] at [facility]

## 2022-09-07 DIAGNOSIS — M25562 Pain in left knee: Secondary | ICD-10-CM | POA: Diagnosis not present

## 2022-09-07 DIAGNOSIS — M1712 Unilateral primary osteoarthritis, left knee: Secondary | ICD-10-CM | POA: Diagnosis not present

## 2022-10-09 DIAGNOSIS — N920 Excessive and frequent menstruation with regular cycle: Secondary | ICD-10-CM | POA: Diagnosis not present

## 2022-10-25 DIAGNOSIS — N920 Excessive and frequent menstruation with regular cycle: Secondary | ICD-10-CM | POA: Diagnosis not present

## 2022-10-28 DIAGNOSIS — M25562 Pain in left knee: Secondary | ICD-10-CM | POA: Diagnosis not present

## 2022-10-28 DIAGNOSIS — G8929 Other chronic pain: Secondary | ICD-10-CM | POA: Diagnosis not present

## 2022-10-28 DIAGNOSIS — M6281 Muscle weakness (generalized): Secondary | ICD-10-CM | POA: Diagnosis not present

## 2022-11-04 DIAGNOSIS — N921 Excessive and frequent menstruation with irregular cycle: Secondary | ICD-10-CM | POA: Diagnosis not present

## 2022-11-04 DIAGNOSIS — N939 Abnormal uterine and vaginal bleeding, unspecified: Secondary | ICD-10-CM | POA: Diagnosis not present

## 2022-11-04 DIAGNOSIS — Z124 Encounter for screening for malignant neoplasm of cervix: Secondary | ICD-10-CM | POA: Diagnosis not present

## 2022-11-04 DIAGNOSIS — E669 Obesity, unspecified: Secondary | ICD-10-CM | POA: Diagnosis not present

## 2022-11-05 DIAGNOSIS — N921 Excessive and frequent menstruation with irregular cycle: Secondary | ICD-10-CM | POA: Diagnosis not present

## 2022-11-05 DIAGNOSIS — Z124 Encounter for screening for malignant neoplasm of cervix: Secondary | ICD-10-CM | POA: Diagnosis not present

## 2022-11-05 DIAGNOSIS — Z6839 Body mass index (BMI) 39.0-39.9, adult: Secondary | ICD-10-CM | POA: Diagnosis not present

## 2022-11-05 DIAGNOSIS — E669 Obesity, unspecified: Secondary | ICD-10-CM | POA: Diagnosis not present

## 2022-11-12 LAB — RESULTS CONSOLE HPV: CHL HPV: NEGATIVE

## 2022-11-12 LAB — HM PAP SMEAR: HM Pap smear: NEGATIVE

## 2022-11-22 HISTORY — PX: LAPAROSCOPIC ABLATION OF UTERINE FIBROIDS WITH ACESSA: SHX7022

## 2022-12-27 DIAGNOSIS — N939 Abnormal uterine and vaginal bleeding, unspecified: Secondary | ICD-10-CM | POA: Diagnosis not present

## 2022-12-31 DIAGNOSIS — N939 Abnormal uterine and vaginal bleeding, unspecified: Secondary | ICD-10-CM | POA: Diagnosis not present

## 2023-01-12 DIAGNOSIS — N939 Abnormal uterine and vaginal bleeding, unspecified: Secondary | ICD-10-CM | POA: Diagnosis not present

## 2023-01-12 DIAGNOSIS — D259 Leiomyoma of uterus, unspecified: Secondary | ICD-10-CM | POA: Diagnosis not present

## 2023-01-12 DIAGNOSIS — Z30430 Encounter for insertion of intrauterine contraceptive device: Secondary | ICD-10-CM | POA: Diagnosis not present

## 2023-01-14 ENCOUNTER — Encounter: Payer: BC Managed Care – PPO | Admitting: Internal Medicine

## 2023-01-25 DIAGNOSIS — Z9889 Other specified postprocedural states: Secondary | ICD-10-CM | POA: Diagnosis not present

## 2023-02-14 ENCOUNTER — Other Ambulatory Visit: Payer: Self-pay | Admitting: Internal Medicine

## 2023-02-14 DIAGNOSIS — J4520 Mild intermittent asthma, uncomplicated: Secondary | ICD-10-CM

## 2023-02-14 DIAGNOSIS — J4541 Moderate persistent asthma with (acute) exacerbation: Secondary | ICD-10-CM

## 2023-03-15 DIAGNOSIS — Z30431 Encounter for routine checking of intrauterine contraceptive device: Secondary | ICD-10-CM | POA: Diagnosis not present

## 2023-04-21 ENCOUNTER — Encounter: Payer: BC Managed Care – PPO | Admitting: Internal Medicine

## 2023-05-18 ENCOUNTER — Encounter: Payer: BC Managed Care – PPO | Admitting: Internal Medicine

## 2023-06-14 ENCOUNTER — Other Ambulatory Visit: Payer: Self-pay | Admitting: Internal Medicine

## 2023-06-14 DIAGNOSIS — J4530 Mild persistent asthma, uncomplicated: Secondary | ICD-10-CM

## 2023-08-25 IMAGING — MG MM DIGITAL SCREENING BILAT W/ TOMO AND CAD
6 of 12 series · 6 of 36 positions shown · non-contrast
Comparison: Previous exam(s).

ACR Breast Density Category a: The breast tissue is almost entirely
fatty.

CLINICAL DATA: Screening.

EXAM:
DIGITAL SCREENING BILATERAL MAMMOGRAM WITH TOMOSYNTHESIS AND CAD
TECHNIQUE: Bilateral screening digital craniocaudal and mediolateral oblique
mammograms were obtained. Bilateral screening digital breast
tomosynthesis was performed. The images were evaluated with
computer-aided detection.

[L CC synth-2D]
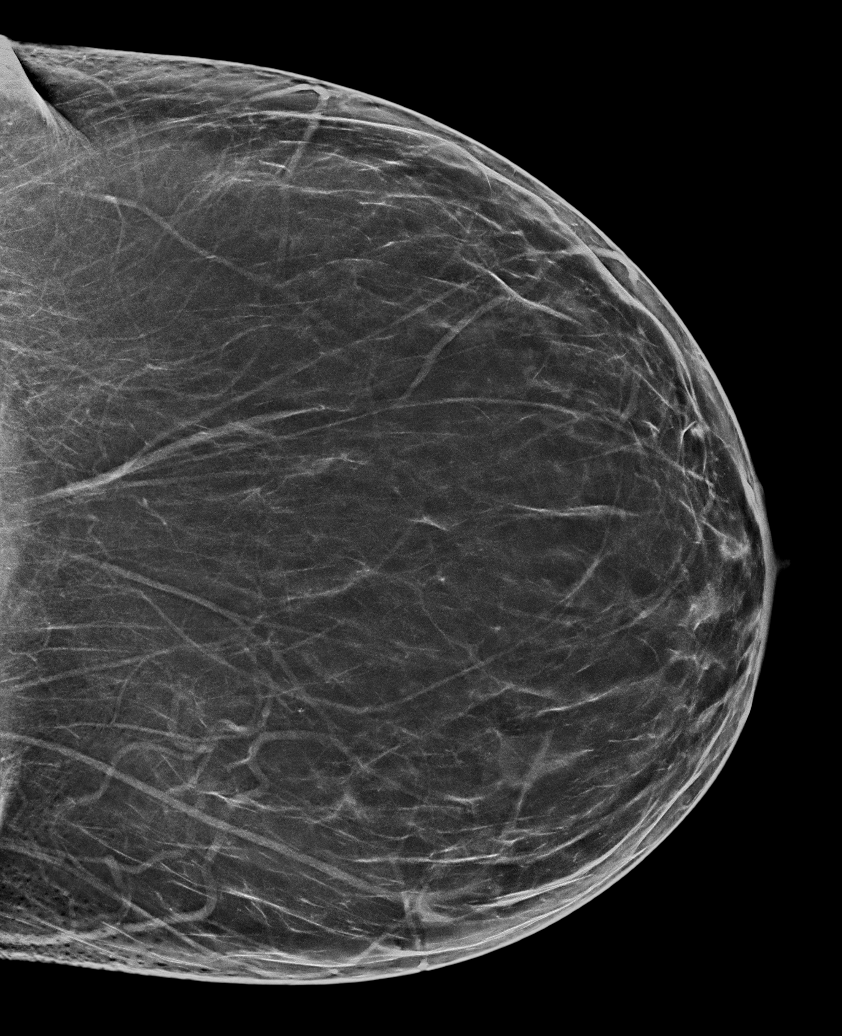

[R CC synth-2D]
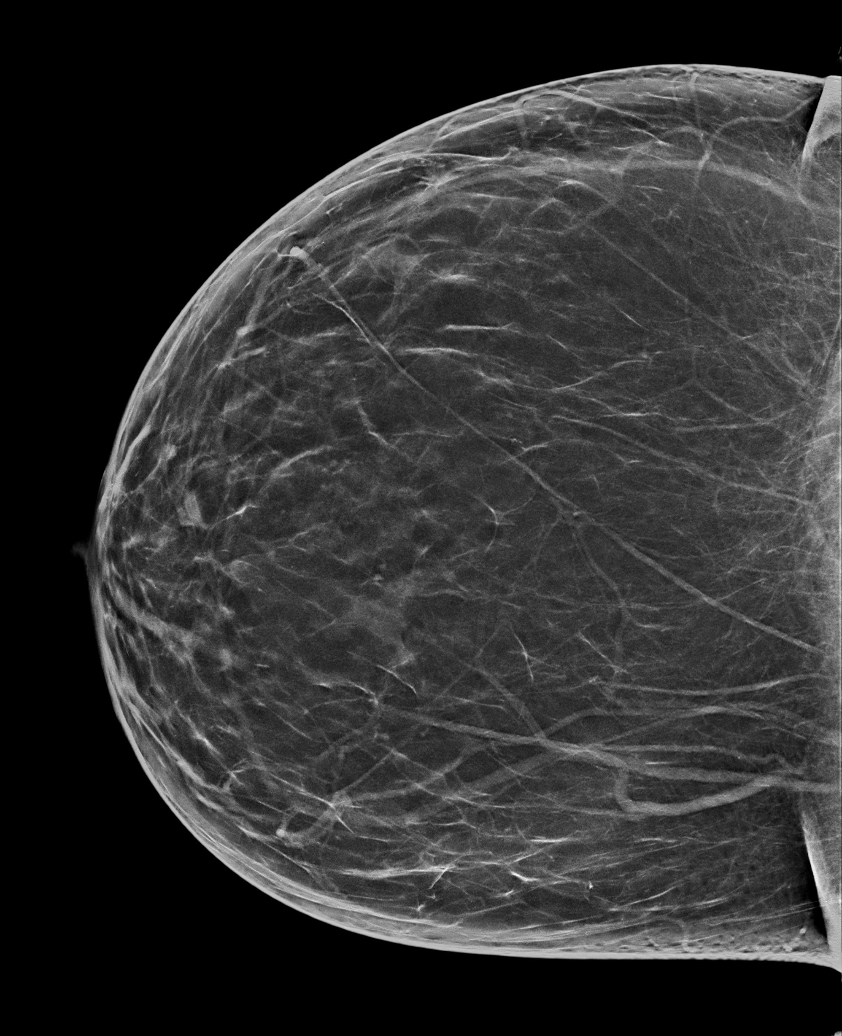

[R MLO synth-2D (1 of 2)]
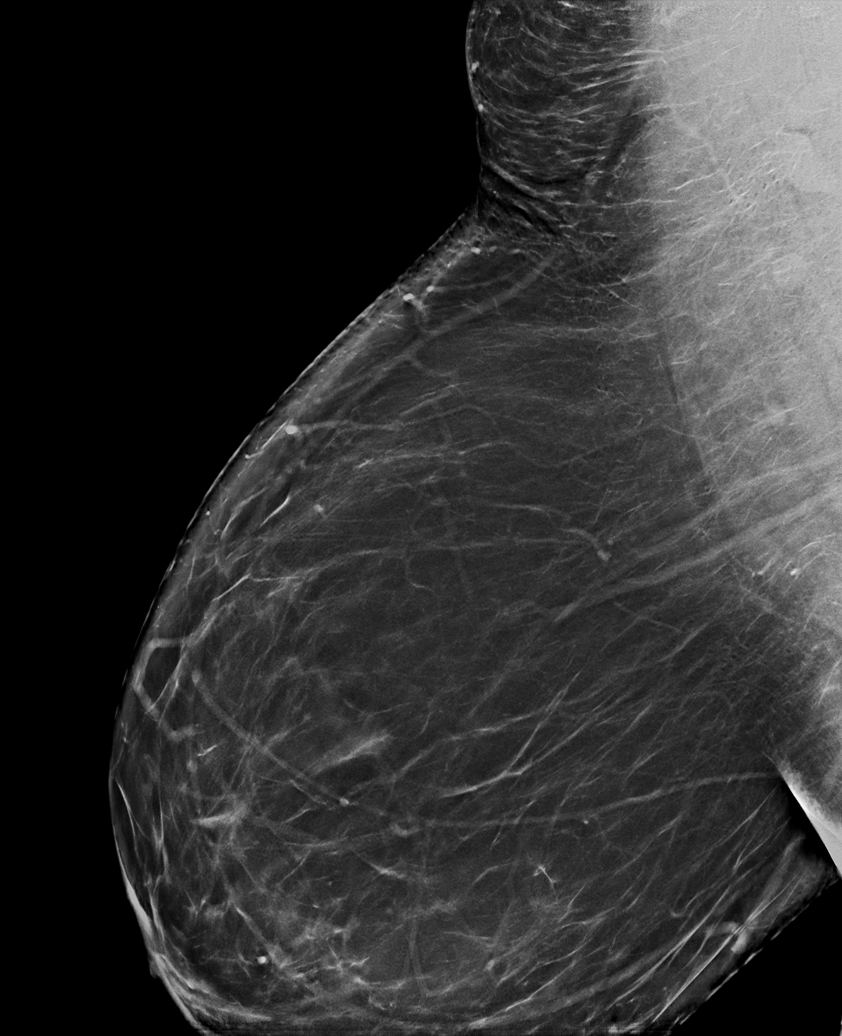

[L MLO synth-2D (1 of 2)]
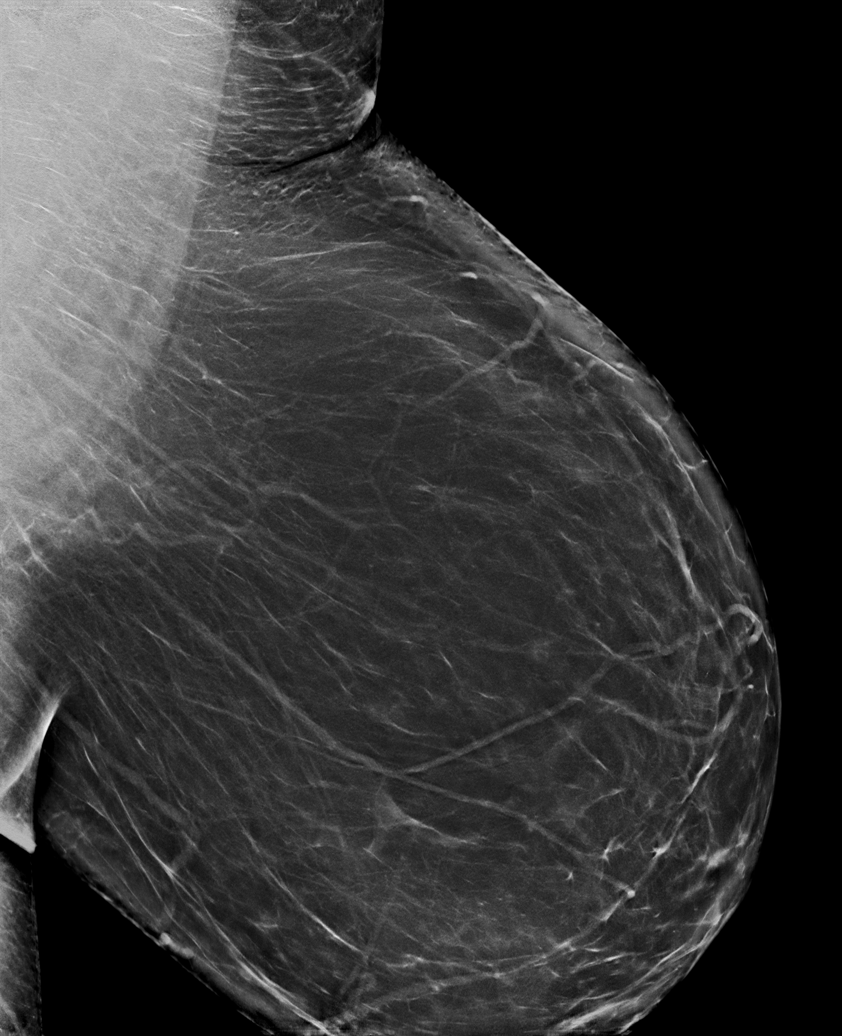

[R MLO synth-2D (2 of 2)]
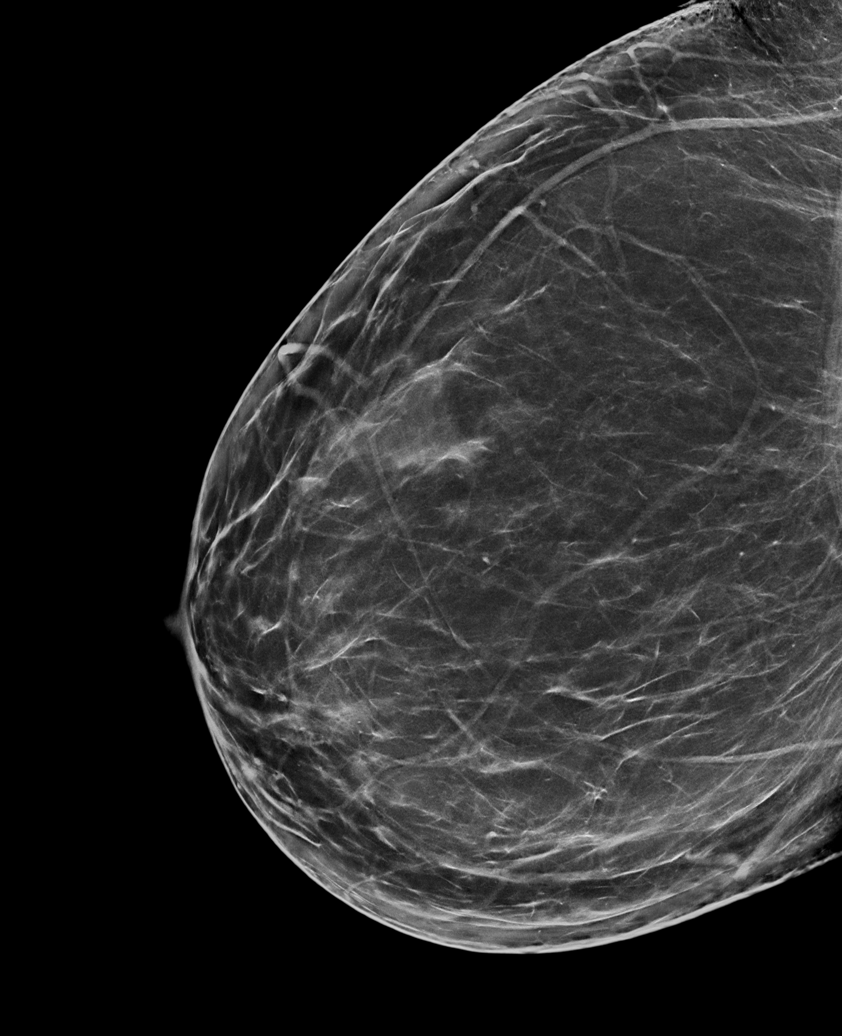

[L MLO synth-2D (2 of 2)]
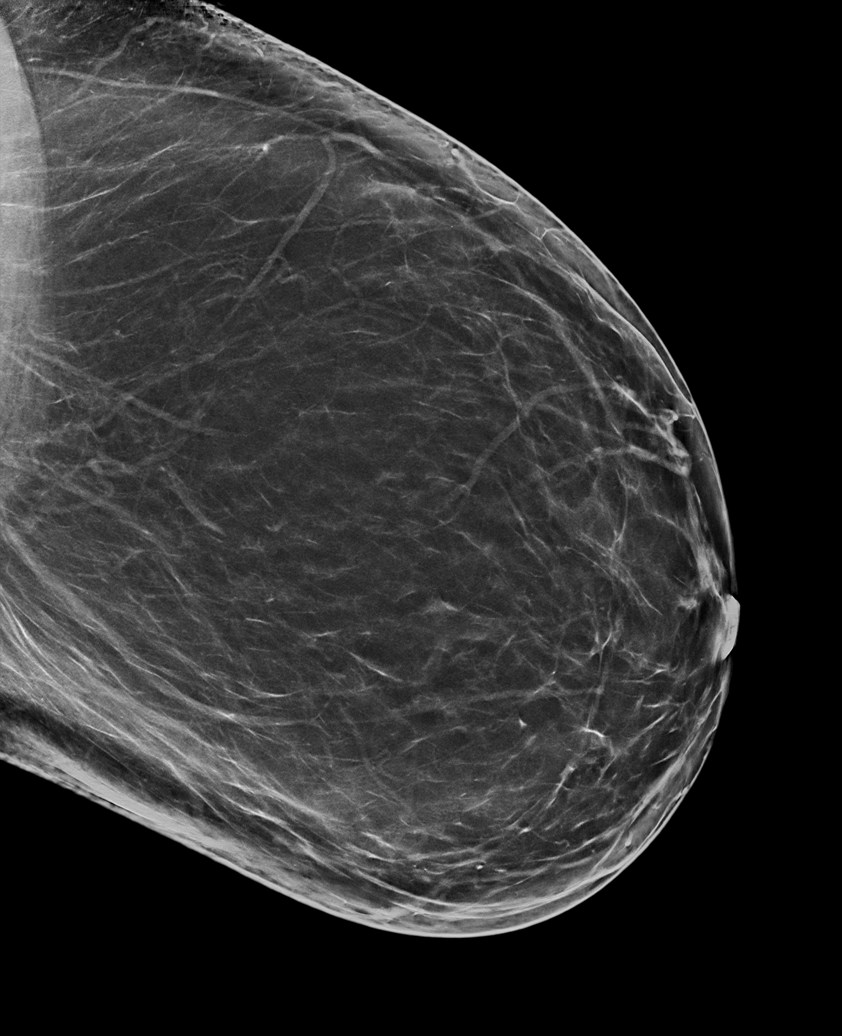

[6 of 36 positions shown; findings below may reference images not displayed]

FINDINGS: There are no findings suspicious for malignancy.
IMPRESSION: No mammographic evidence of malignancy. A result letter of this
screening mammogram will be mailed directly to the patient.

RECOMMENDATION:
Screening mammogram in one year. (Code:0E-3-N98)

BI-RADS CATEGORY  1: Negative.

## 2023-10-18 ENCOUNTER — Ambulatory Visit (INDEPENDENT_AMBULATORY_CARE_PROVIDER_SITE_OTHER): Payer: BC Managed Care – PPO | Admitting: Internal Medicine

## 2023-10-18 ENCOUNTER — Encounter: Payer: Self-pay | Admitting: Internal Medicine

## 2023-10-18 VITALS — BP 144/92 | HR 79 | Ht 67.0 in | Wt 255.0 lb

## 2023-10-18 DIAGNOSIS — J4520 Mild intermittent asthma, uncomplicated: Secondary | ICD-10-CM | POA: Diagnosis not present

## 2023-10-18 DIAGNOSIS — Z13220 Encounter for screening for lipoid disorders: Secondary | ICD-10-CM

## 2023-10-18 DIAGNOSIS — Z1231 Encounter for screening mammogram for malignant neoplasm of breast: Secondary | ICD-10-CM

## 2023-10-18 DIAGNOSIS — Z23 Encounter for immunization: Secondary | ICD-10-CM

## 2023-10-18 DIAGNOSIS — R030 Elevated blood-pressure reading, without diagnosis of hypertension: Secondary | ICD-10-CM

## 2023-10-18 DIAGNOSIS — Z0000 Encounter for general adult medical examination without abnormal findings: Secondary | ICD-10-CM

## 2023-10-18 DIAGNOSIS — Z131 Encounter for screening for diabetes mellitus: Secondary | ICD-10-CM

## 2023-10-18 MED ORDER — IRBESARTAN 150 MG PO TABS: 150.0000 mg | ORAL_TABLET | Freq: Every day | ORAL | 0 refills | Status: DC

## 2023-10-18 NOTE — Assessment & Plan Note (Addendum)
Asthma well controlled with Symbicort and albuterol PRN Recently added Singulair

## 2023-10-18 NOTE — Assessment & Plan Note (Signed)
Persistent mild elevation in BP Will start medications and recheck on 2 months

## 2023-10-18 NOTE — Patient Instructions (Signed)
Call [facility] to schedule your mammogram at [phone].  

## 2023-10-18 NOTE — Progress Notes (Signed)
Date:  10/18/2023   Name:  Katherine Adkins   DOB:  1975-12-15   MRN:  [id number]   Chief Complaint: Annual Exam Katherine Adkins is a 47 y.o. female who presents today for her Complete Annual Exam. She feels well. She reports exercising walking daily. She reports she is sleeping fairly well. Breast complaints none.  Mammogram: 02/2022 [facility] DEXA: none Colonoscopy: 01/2022 repeat 5 yrs  Health Maintenance Due  Topic Date Due   HIV Screening  Never done   DTaP/Tdap/Td (2 - Td or Tdap) 05/16/2017    Immunization History  Administered Date(s) Administered   Influenza Inj Mdck Quad Pf 08/14/2022   Influenza, Seasonal, Injecte, Preservative Fre 10/18/2023   Influenza,inj,Quad PF,6+ Mos 12/20/2018, 09/14/2019, 08/27/2020   Influenza-Unspecified 09/22/2021   Moderna Sars-Covid-2 Vaccination 03/22/2020, 04/22/2020   Tdap 05/17/2007   Unspecified SARS-COV-2 Vaccination 08/14/2022     HPI  Review of Systems  Constitutional:  Negative for chills, fatigue and fever.  HENT:  Negative for congestion, hearing loss, tinnitus, trouble swallowing and voice change.   Eyes:  Negative for visual disturbance.  Respiratory:  Negative for cough, chest tightness, shortness of breath and wheezing.   Cardiovascular:  Negative for chest pain, palpitations and leg swelling.  Gastrointestinal:  Negative for abdominal pain, constipation, diarrhea and vomiting.  Endocrine: Negative for polydipsia and polyuria.  Genitourinary:  Negative for dysuria, frequency, genital sores, vaginal bleeding and vaginal discharge.  Musculoskeletal:  Negative for arthralgias, gait problem and joint swelling.  Skin:  Negative for color change and rash.  Neurological:  Negative for dizziness, tremors, light-headedness and headaches.  Hematological:  Negative for adenopathy. Does not bruise/bleed easily.  Psychiatric/Behavioral:  Negative for dysphoric mood and sleep disturbance. The patient is not nervous/anxious.       Lab Results  Component Value Date   NA 138 01/08/2022   K 4.2 01/08/2022   CO2 23 01/08/2022   GLUCOSE 106 (H) 01/08/2022   BUN 11 01/08/2022   CREATININE 0.89 01/08/2022   CALCIUM 9.4 01/08/2022   EGFR 81 01/08/2022   GFRNONAA 94 09/14/2019   Lab Results  Component Value Date   CHOL 133 01/08/2022   HDL 62 01/08/2022   LDLCALC 57 01/08/2022   TRIG 67 01/08/2022   CHOLHDL 2.1 01/08/2022   Lab Results  Component Value Date   TSH 2.200 01/08/2022   Lab Results  Component Value Date   HGBA1C 5.5 01/08/2022   Lab Results  Component Value Date   WBC 4.5 01/08/2022   HGB 12.8 01/08/2022   HCT 40.2 01/08/2022   MCV 88 01/08/2022   PLT 287 01/08/2022   Lab Results  Component Value Date   ALT 20 01/08/2022   AST 16 01/08/2022   ALKPHOS 88 01/08/2022   BILITOT 0.5 01/08/2022   No results found for: "25OHVITD2", "25OHVITD3", "VD25OH"   Patient Active Problem List   Diagnosis Date Noted   Mild intermittent asthma without complication 10/31/2018   Benign neoplasm of descending colon    Elevated blood pressure, situational 11/05/2015   Cannot sleep 11/05/2015   Low back strain 11/05/2015   Snores 11/05/2015    No Known Allergies  Past Surgical History:  Procedure Laterality Date   CESAREAN SECTION  2004, 2007   COLONOSCOPY  2007   Spanarkel - single benign polyp   COLONOSCOPY WITH PROPOFOL N/A 05/31/2016   Procedure: COLONOSCOPY WITH PROPOFOL;  Surgeon: Midge Minium, MD;  Location: [facility];  Service: Endoscopy;  Laterality:  N/A;   POLYPECTOMY  05/31/2016   Procedure: POLYPECTOMY;  Surgeon: Midge Minium, MD;  Location: [facility];  Service: Endoscopy;;    Social History   Tobacco Use   Smoking status: Never   Smokeless tobacco: Never  Substance Use Topics   Alcohol use: No    Alcohol/week: 0.0 standard drinks of alcohol    Comment: 1 glass of wine per month   Drug use: No     Medication list has been reviewed and  updated.  Current Meds  Medication Sig   albuterol (VENTOLIN HFA) 108 (90 Base) MCG/ACT inhaler INHALE 2 PUFFS INTO THE LUNGS EVERY 4 HOURS AS NEEDED FOR WHEEZE OR FOR SHORTNESS OF BREATH   gabapentin (NEURONTIN) 100 MG capsule    irbesartan (AVAPRO) 150 MG tablet Take 1 tablet (150 mg total) by mouth daily.   levonorgestrel (MIRENA) 20 MCG/DAY IUD 1 each by Intrauterine route once.   montelukast (SINGULAIR) 10 MG tablet TAKE 1 TABLET BY MOUTH EVERYDAY AT BEDTIME   Multiple Vitamins-Minerals (MULTIVITAMIN WITH MINERALS) tablet Take 1 tablet by mouth daily.   SYMBICORT 80-4.5 MCG/ACT inhaler INHALE 2 PUFFS INTO THE LUNGS 2 (TWO) TIMES DAILY. IN THE MORNING AND AT BEDTIME.   [DISCONTINUED] triamcinolone (KENALOG) 0.025 % cream Apply topically 2 (two) times daily.       10/18/2023    9:38 AM 01/08/2022   10:12 AM 12/07/2021   11:29 AM 08/20/2021   10:56 AM  GAD 7 : Generalized Anxiety Score  Nervous, Anxious, on Edge 0 0 0 0  Control/stop worrying 0 0 0 0  Worry too much - different things 0 0 0 0  Trouble relaxing 0 0 0 0  Restless 0 0 0 0  Easily annoyed or irritable 0 0 0 0  Afraid - awful might happen 0 0 0 0  Total GAD 7 Score 0 0 0 0  Anxiety Difficulty Not difficult at all Not difficult at all Not difficult at all Not difficult at all       10/18/2023    9:38 AM 01/08/2022   10:12 AM 12/07/2021   11:29 AM  Depression screen PHQ 2/9  Decreased Interest 0 0 0  Down, Depressed, Hopeless 0 0 0  PHQ - 2 Score 0 0 0  Altered sleeping 0 1 0  Tired, decreased energy 0 0 0  Change in appetite 0 0 0  Feeling bad or failure about yourself  0 0 0  Trouble concentrating 0 0 0  Moving slowly or fidgety/restless 0 0 0  Suicidal thoughts 0 0 0  PHQ-9 Score 0 1 0  Difficult doing work/chores Not difficult at all Not difficult at all Not difficult at all    BP Readings from Last 3 Encounters:  10/18/23 (!) 144/92  01/08/22 136/78  08/20/21 132/80    Physical Exam Vitals and  nursing note reviewed.  Constitutional:      General: She is not in acute distress.    Appearance: She is well-developed.  HENT:     Head: Normocephalic and atraumatic.     Right Ear: Tympanic membrane and ear canal normal.     Left Ear: Tympanic membrane and ear canal normal.     Nose:     Right Sinus: No maxillary sinus tenderness.     Left Sinus: No maxillary sinus tenderness.  Eyes:     General: No scleral icterus.       Right eye: No discharge.  Left eye: No discharge.     Conjunctiva/sclera: Conjunctivae normal.  Neck:     Thyroid: No thyromegaly.     Vascular: No carotid bruit.  Cardiovascular:     Rate and Rhythm: Normal rate and regular rhythm.     Pulses: Normal pulses.     Heart sounds: Normal heart sounds.  Pulmonary:     Effort: Pulmonary effort is normal. No respiratory distress.     Breath sounds: No wheezing.  Abdominal:     General: Bowel sounds are normal.     Palpations: Abdomen is soft.     Tenderness: There is no abdominal tenderness.  Musculoskeletal:     Cervical back: Normal range of motion. No erythema.     Right lower leg: No edema.     Left lower leg: No edema.  Lymphadenopathy:     Cervical: No cervical adenopathy.  Skin:    General: Skin is warm and dry.     Findings: No rash.  Neurological:     Mental Status: She is alert and oriented to person, place, and time.     Cranial Nerves: No cranial nerve deficit.     Sensory: No sensory deficit.     Deep Tendon Reflexes: Reflexes are normal and symmetric.  Psychiatric:        Attention and Perception: Attention normal.        Mood and Affect: Mood normal.     Wt Readings from Last 3 Encounters:  10/18/23 255 lb (115.7 kg)  01/08/22 256 lb 3.2 oz (116.2 kg)  12/07/21 261 lb 12.8 oz (118.8 kg)    BP (!) 144/92 (BP Location: Right Arm, Cuff Size: Large)   Pulse 79   Ht 5\' 7"  (1.702 m)   Wt 255 lb (115.7 kg)   SpO2 98%   BMI 39.94 kg/m   Assessment and Plan:  Problem List  Items Addressed This Visit       Unprioritized   Elevated blood pressure, situational    Persistent mild elevation in BP Will start medications and recheck on 2 months      Relevant Medications   irbesartan (AVAPRO) 150 MG tablet   Other Relevant Orders   CBC with Differential/Platelet   Comprehensive metabolic panel   Urinalysis, Routine w reflex microscopic   Mild intermittent asthma without complication    Asthma well controlled with Symbicort and albuterol PRN Recently added Singulair      Relevant Orders   CBC with Differential/Platelet   Other Visit Diagnoses     Annual physical exam    -  Primary   Relevant Orders   CBC with Differential/Platelet   Comprehensive metabolic panel   Hemoglobin A1c   Lipid panel   TSH   Urinalysis, Routine w reflex microscopic   Encounter for screening mammogram for breast cancer       Relevant Orders   MM 3D SCREENING MAMMOGRAM BILATERAL BREAST   Screening for diabetes mellitus       Relevant Orders   Comprehensive metabolic panel   Hemoglobin A1c   Screening for lipid disorders       Relevant Orders   Lipid panel   Need for influenza vaccination       Relevant Orders   Flu vaccine trivalent PF, 6mos and older(Flulaval,Afluria,Fluarix,Fluzone) (Completed)       Return in about 2 months (around 12/18/2023) for HTN.    Reubin Milan, MD [facility] [place]

## 2023-10-19 LAB — HEMOGLOBIN A1C
Est. average glucose Bld gHb Est-mCnc: 123 mg/dL
Hgb A1c MFr Bld: 5.9 % — ABNORMAL HIGH (ref 4.8–5.6)

## 2023-10-19 LAB — COMPREHENSIVE METABOLIC PANEL
ALT: 21 [IU]/L (ref 0–32)
AST: 20 [IU]/L (ref 0–40)
Albumin: 4.3 g/dL (ref 3.9–4.9)
Alkaline Phosphatase: 107 [IU]/L (ref 44–121)
BUN/Creatinine Ratio: 14 (ref 9–23)
BUN: 12 mg/dL (ref 6–24)
Bilirubin Total: 0.4 mg/dL (ref 0.0–1.2)
CO2: 24 mmol/L (ref 20–29)
Calcium: 9.7 mg/dL (ref 8.7–10.2)
Chloride: 104 mmol/L (ref 96–106)
Creatinine, Ser: 0.86 mg/dL (ref 0.57–1.00)
Globulin, Total: 3.2 g/dL (ref 1.5–4.5)
Glucose: 101 mg/dL — ABNORMAL HIGH (ref 70–99)
Potassium: 4 mmol/L (ref 3.5–5.2)
Sodium: 141 mmol/L (ref 134–144)
Total Protein: 7.5 g/dL (ref 6.0–8.5)
eGFR: 83 mL/min/{1.73_m2} (ref >59)

## 2023-10-19 LAB — URINALYSIS, ROUTINE W REFLEX MICROSCOPIC
Bilirubin, UA: NEGATIVE
Glucose, UA: NEGATIVE
Ketones, UA: NEGATIVE
Leukocytes,UA: NEGATIVE
Nitrite, UA: NEGATIVE
Protein,UA: NEGATIVE
Specific Gravity, UA: 1.012 (ref 1.005–1.030)
Urobilinogen, Ur: 0.2 mg/dL (ref 0.2–1.0)
pH, UA: 6.5 (ref 5.0–7.5)

## 2023-10-19 LAB — LIPID PANEL
Chol/HDL Ratio: 2.7 {ratio} (ref 0.0–4.4)
Cholesterol, Total: 140 mg/dL (ref 100–199)
HDL: 52 mg/dL (ref >39)
LDL Chol Calc (NIH): 76 mg/dL (ref 0–99)
Triglycerides: 57 mg/dL (ref 0–149)
VLDL Cholesterol Cal: 12 mg/dL (ref 5–40)

## 2023-10-19 LAB — CBC WITH DIFFERENTIAL/PLATELET
Basophils Absolute: 0.1 10*3/uL (ref 0.0–0.2)
Basos: 1 %
EOS (ABSOLUTE): 0.1 10*3/uL (ref 0.0–0.4)
Eos: 2 %
Hematocrit: 41.3 % (ref 34.0–46.6)
Hemoglobin: 13.3 g/dL (ref 11.1–15.9)
Immature Grans (Abs): 0 10*3/uL (ref 0.0–0.1)
Immature Granulocytes: 0 %
Lymphocytes Absolute: 1.5 10*3/uL (ref 0.7–3.1)
Lymphs: 36 %
MCH: 28.1 pg (ref 26.6–33.0)
MCHC: 32.2 g/dL (ref 31.5–35.7)
MCV: 87 fL (ref 79–97)
Monocytes Absolute: 0.5 10*3/uL (ref 0.1–0.9)
Monocytes: 12 %
Neutrophils Absolute: 2 10*3/uL (ref 1.4–7.0)
Neutrophils: 49 %
Platelets: 307 10*3/uL (ref 150–450)
RBC: 4.74 x10E6/uL (ref 3.77–5.28)
RDW: 12.2 % (ref 11.7–15.4)
WBC: 4.2 10*3/uL (ref 3.4–10.8)

## 2023-10-19 LAB — TSH: TSH: 1.86 u[IU]/mL (ref 0.450–4.500)

## 2023-10-19 LAB — MICROSCOPIC EXAMINATION
Bacteria, UA: NONE SEEN
Casts: NONE SEEN /[LPF]
Epithelial Cells (non renal): NONE SEEN /[HPF] (ref 0–10)
RBC, Urine: NONE SEEN /[HPF] (ref 0–2)
WBC, UA: NONE SEEN /[HPF] (ref 0–5)

## 2023-10-25 ENCOUNTER — Other Ambulatory Visit: Payer: Self-pay | Admitting: Internal Medicine

## 2023-10-25 DIAGNOSIS — Z1231 Encounter for screening mammogram for malignant neoplasm of breast: Secondary | ICD-10-CM

## 2023-10-26 ENCOUNTER — Ambulatory Visit
Admission: RE | Admit: 2023-10-26 | Discharge: 2023-10-26 | Disposition: A | Discharge status: Home / Self Care | Payer: BC Managed Care – PPO | Source: Ambulatory Visit

## 2023-10-26 DIAGNOSIS — Z1231 Encounter for screening mammogram for malignant neoplasm of breast: Secondary | ICD-10-CM

## 2023-11-09 ENCOUNTER — Ambulatory Visit: Payer: Self-pay

## 2023-11-09 NOTE — Telephone Encounter (Signed)
Chief Complaint: Rash Symptoms: itching, redness, bumps, eye swelling Frequency: constant after taking Ibresartan Pertinent Negatives: Patient denies SOB, difficulty breathing, chest pain Disposition: [] ED /[] Urgent Care (no appt availability in office) / [x] Appointment(In office/virtual)/ []  [facility]/ [] Home Care/ [] Refused Recommended Disposition /[] [facility]/ []  Follow-up with PCP Additional Notes: Patient stated she started taking Irbesartan 150 MG on 10/18/23 and noticed eye swelling but thought it was from the eye make up she was wearing. Patient states every time she takes the medication she experiences a rash on her face near the eyes, eye swelling and moderate itching of the neck and face. Patient states taking Benadryl helps relieve the symptoms. Patient states the reaction is getting worse as she continues to take the medication. Care advice given and patient has been scheduled to be evaluated by PCP tomorrow. Advised patient to hold medication until advised by PCP at the appointment tomorrow. Patient verbalized understanding.   Irbesartan 150 10/18/23 Reason for Disposition  Localized rash present > 7 days  Answer Assessment - Initial Assessment Questions 1. APPEARANCE of RASH: "Describe the rash."      Bumps  2. LOCATION: "Where is the rash located?"      Under eyes and neck 3. NUMBER: "How many spots are there?"      3 4. SIZE: "How big are the spots?" (Inches, centimeters or compare to size of a coin)      Unsure  5. ONSET: "When did the rash start?"      A few days after I started a new medication on 10/16/23 6. ITCHING: "Does the rash itch?" If Yes, ask: "How bad is the itch?"  (Scale 0-10; or none, mild, moderate, severe)     Moderate  7. PAIN: "Does the rash hurt?" If Yes, ask: "How bad is the pain?"  (Scale 0-10; or none, mild, moderate, severe)    - NONE (0): no pain    - MILD (1-3): doesn't interfere with normal activities     - MODERATE  (4-7): interferes with normal activities or awakens from sleep     - SEVERE (8-10): excruciating pain, unable to do any normal activities     none 8. OTHER SYMPTOMS: "Do you have any other symptoms?" (e.g., fever)     Itching, redness, eye swelling  Protocols used: Rash or Redness - Localized-A-AH

## 2023-11-10 ENCOUNTER — Ambulatory Visit: Payer: BC Managed Care – PPO | Admitting: Internal Medicine

## 2023-11-10 ENCOUNTER — Encounter: Payer: Self-pay | Admitting: Internal Medicine

## 2023-11-10 VITALS — BP 142/88 | HR 94 | Ht 67.0 in | Wt 254.0 lb

## 2023-11-10 DIAGNOSIS — I10 Essential (primary) hypertension: Secondary | ICD-10-CM | POA: Diagnosis not present

## 2023-11-10 DIAGNOSIS — T7840XA Allergy, unspecified, initial encounter: Secondary | ICD-10-CM

## 2023-11-10 MED ORDER — AMLODIPINE BESYLATE 5 MG PO TABS
5.0000 mg | ORAL_TABLET | Freq: Every day | ORAL | 0 refills | Status: DC
Start: 2023-11-10 — End: 2024-01-03

## 2023-11-10 NOTE — Progress Notes (Signed)
Date:  11/10/2023   Name:  Katherine Adkins   DOB:  Jun 30, 1976   MRN:  [id number]   Chief Complaint: Rash (Patient said she has had a itching rash since she started taking Irbesartan 2-3 weeks ago. Patient last dose was last night. Noticed the rash 4 days after she started medication. It pops up right after taking the medicine at night.)  Rash This is a new problem. Episode onset: since started Irbesartan. The problem has been gradually improving since onset. The affected locations include the face, lips and neck. Pertinent negatives include no fatigue, fever or shortness of breath.  She is responding to benadryl and has not had any breathing issues or tongue swelling.  Last dose was 2 days ago.  Review of Systems  Constitutional:  Negative for chills, fatigue and fever.  HENT:  Positive for facial swelling. Negative for trouble swallowing.   Respiratory:  Negative for chest tightness, shortness of breath and wheezing.   Cardiovascular:  Negative for chest pain.  Musculoskeletal:  Negative for arthralgias.  Skin:  Positive for rash.  Psychiatric/Behavioral:  Negative for dysphoric mood and sleep disturbance. The patient is not nervous/anxious.      Lab Results  Component Value Date   NA 141 10/18/2023   K 4.0 10/18/2023   CO2 24 10/18/2023   GLUCOSE 101 (H) 10/18/2023   BUN 12 10/18/2023   CREATININE 0.86 10/18/2023   CALCIUM 9.7 10/18/2023   EGFR 83 10/18/2023   GFRNONAA 94 09/14/2019   Lab Results  Component Value Date   CHOL 140 10/18/2023   HDL 52 10/18/2023   LDLCALC 76 10/18/2023   TRIG 57 10/18/2023   CHOLHDL 2.7 10/18/2023   Lab Results  Component Value Date   TSH 1.860 10/18/2023   Lab Results  Component Value Date   HGBA1C 5.9 (H) 10/18/2023   Lab Results  Component Value Date   WBC 4.2 10/18/2023   HGB 13.3 10/18/2023   HCT 41.3 10/18/2023   MCV 87 10/18/2023   PLT 307 10/18/2023   Lab Results  Component Value Date   ALT 21 10/18/2023    AST 20 10/18/2023   ALKPHOS 107 10/18/2023   BILITOT 0.4 10/18/2023   No results found for: "25OHVITD2", "25OHVITD3", "VD25OH"   Patient Active Problem List   Diagnosis Date Noted   Essential hypertension 11/10/2023   Mild intermittent asthma without complication 10/31/2018   Benign neoplasm of descending colon    Cannot sleep 11/05/2015   Low back strain 11/05/2015   Snores 11/05/2015    Allergies  Allergen Reactions   Irbesartan Swelling and Rash    Past Surgical History:  Procedure Laterality Date   CESAREAN SECTION  2004, 2007   COLONOSCOPY  2007   Span[place]el - single benign polyp   COLONOSCOPY WITH PROPOFOL N/A 05/31/2016   Procedure: COLONOSCOPY WITH PROPOFOL;  Surgeon: Midge Minium, MD;  Location: [facility];  Service: Endoscopy;  Laterality: N/A;   POLYPECTOMY  05/31/2016   Procedure: POLYPECTOMY;  Surgeon: Midge Minium, MD;  Location: [facility];  Service: Endoscopy;;    Social History   Tobacco Use   Smoking status: Never   Smokeless tobacco: Never  Substance Use Topics   Alcohol use: No    Alcohol/week: 0.0 standard drinks of alcohol    Comment: 1 glass of wine per month   Drug use: No     Medication list has been reviewed and updated.  Current Meds  Medication Sig  albuterol (VENTOLIN HFA) 108 (90 Base) MCG/ACT inhaler INHALE 2 PUFFS INTO THE LUNGS EVERY 4 HOURS AS NEEDED FOR WHEEZE OR FOR SHORTNESS OF BREATH   amLODipine (NORVASC) 5 MG tablet Take 1 tablet (5 mg total) by mouth daily.   gabapentin (NEURONTIN) 100 MG capsule    levonorgestrel (MIRENA) 20 MCG/DAY IUD 1 each by Intrauterine route once.   montelukast (SINGULAIR) 10 MG tablet TAKE 1 TABLET BY MOUTH EVERYDAY AT BEDTIME   Multiple Vitamins-Minerals (MULTIVITAMIN WITH MINERALS) tablet Take 1 tablet by mouth daily.   SYMBICORT 80-4.5 MCG/ACT inhaler INHALE 2 PUFFS INTO THE LUNGS 2 (TWO) TIMES DAILY. IN THE MORNING AND AT BEDTIME.       11/10/2023   10:29 AM 10/18/2023     9:38 AM 01/08/2022   10:12 AM 12/07/2021   11:29 AM  GAD 7 : Generalized Anxiety Score  Nervous, Anxious, on Edge 0 0 0 0  Control/stop worrying 0 0 0 0  Worry too much - different things 0 0 0 0  Trouble relaxing 0 0 0 0  Restless 0 0 0 0  Easily annoyed or irritable 0 0 0 0  Afraid - awful might happen 0 0 0 0  Total GAD 7 Score 0 0 0 0  Anxiety Difficulty Not difficult at all Not difficult at all Not difficult at all Not difficult at all       11/10/2023   10:29 AM 10/18/2023    9:38 AM 01/08/2022   10:12 AM  Depression screen PHQ 2/9  Decreased Interest 0 0 0  Down, Depressed, Hopeless 0 0 0  PHQ - 2 Score 0 0 0  Altered sleeping 0 0 1  Tired, decreased energy 0 0 0  Change in appetite 0 0 0  Feeling bad or failure about yourself  0 0 0  Trouble concentrating 0 0 0  Moving slowly or fidgety/restless 0 0 0  Suicidal thoughts 0 0 0  PHQ-9 Score 0 0 1  Difficult doing work/chores Not difficult at all Not difficult at all Not difficult at all    BP Readings from Last 3 Encounters:  11/10/23 (!) 142/88  10/18/23 (!) 144/92  01/08/22 136/78    Physical Exam Vitals and nursing note reviewed.  Constitutional:      General: She is not in acute distress.    Appearance: Normal appearance. She is well-developed.  HENT:     Head: Normocephalic and atraumatic.     Mouth/Throat:     Lips: Pink. No lesions (may be slightly swollen).     Tongue: No lesions.  Cardiovascular:     Rate and Rhythm: Normal rate and regular rhythm.     Heart sounds: No murmur heard. Pulmonary:     Effort: Pulmonary effort is normal. No respiratory distress.     Breath sounds: No wheezing or rhonchi.  Musculoskeletal:     Cervical back: Normal range of motion.     Right lower leg: No edema.     Left lower leg: No edema.  Lymphadenopathy:     Cervical: No cervical adenopathy.  Skin:    General: Skin is warm and dry.     Capillary Refill: Capillary refill takes less than 2 seconds.      Findings: Rash (fine rash on left upper chest) present.  Neurological:     General: No focal deficit present.     Mental Status: She is alert and oriented to person, place, and time.  Psychiatric:  Mood and Affect: Mood normal.        Behavior: Behavior normal.     Wt Readings from Last 3 Encounters:  11/10/23 254 lb (115.2 kg)  10/18/23 255 lb (115.7 kg)  01/08/22 256 lb 3.2 oz (116.2 kg)    BP (!) 142/88 (BP Location: Left Arm, Cuff Size: Large)   Pulse 94   Ht 5\' 7"  (1.702 m)   Wt 254 lb (115.2 kg)   SpO2 99%   BMI 39.78 kg/m   Assessment and Plan:  Problem List Items Addressed This Visit       Unprioritized   Essential hypertension - Primary   BP remains elevated - will start amlodipine in three days. Patient hesitant to take a diuretic due to kidney health concerns. Return in 6 weeks for recheck      Relevant Medications   amLODipine (NORVASC) 5 MG tablet   Other Visit Diagnoses       Allergic reaction, initial encounter       should resolve off of Irbesartan continue Benadryl as needed       No follow-ups on file.    Reubin Milan, MD [facility] [place]

## 2023-11-10 NOTE — Assessment & Plan Note (Addendum)
BP remains elevated - will start amlodipine in three days. Patient hesitant to take a diuretic due to kidney health concerns. Return in 6 weeks for recheck

## 2023-11-12 ENCOUNTER — Other Ambulatory Visit: Payer: Self-pay | Admitting: Internal Medicine

## 2023-11-12 DIAGNOSIS — J4541 Moderate persistent asthma with (acute) exacerbation: Secondary | ICD-10-CM

## 2023-11-14 NOTE — Telephone Encounter (Signed)
Requested Prescriptions  Pending Prescriptions Disp Refills   albuterol (VENTOLIN HFA) 108 (90 Base) MCG/ACT inhaler [Pharmacy Med Name: ALBUTEROL HFA (PROAIR) INHALER] 8.5 each 2    Sig: INHALE 2 PUFFS INTO THE LUNGS EVERY 4 HOURS AS NEEDED FOR WHEEZE OR FOR SHORTNESS OF BREATH     Pulmonology:  Beta Agonists 2 Failed - 11/14/2023 12:36 PM      Failed - Last BP in normal range    BP Readings from Last 1 Encounters:  11/10/23 (!) 142/88         Passed - Last Heart Rate in normal range    Pulse Readings from Last 1 Encounters:  11/10/23 94         Passed - Valid encounter within last 12 months    Recent Outpatient Visits           4 days ago Essential hypertension   [facility], Nyoka Cowden, MD   3 weeks ago Annual physical exam   [facility] at [facility], Nyoka Cowden, MD   1 year ago Annual physical exam   [facility] at [facility], Nyoka Cowden, MD   1 year ago COVID-19 virus infection   [facility] at [facility], Nyoka Cowden, MD   2 years ago Acute cystitis with hematuria   [facility] at [facility], Nyoka Cowden, MD       Future Appointments             In 1 month Judithann Graves, Nyoka Cowden, MD [facility] at [facility]

## 2023-12-02 ENCOUNTER — Encounter: Payer: Self-pay | Admitting: Internal Medicine

## 2023-12-02 ENCOUNTER — Ambulatory Visit (INDEPENDENT_AMBULATORY_CARE_PROVIDER_SITE_OTHER): Payer: BC Managed Care – PPO | Admitting: Internal Medicine

## 2023-12-02 VITALS — BP 122/76 | HR 78 | Ht 67.0 in | Wt 253.0 lb

## 2023-12-02 DIAGNOSIS — I10 Essential (primary) hypertension: Secondary | ICD-10-CM | POA: Diagnosis not present

## 2023-12-02 DIAGNOSIS — R21 Rash and other nonspecific skin eruption: Secondary | ICD-10-CM

## 2023-12-02 MED ORDER — TRIAMCINOLONE ACETONIDE 0.5 % EX OINT: 1.0000 | TOPICAL_OINTMENT | Freq: Two times a day (BID) | CUTANEOUS | 0 refills | Status: DC

## 2023-12-02 NOTE — Assessment & Plan Note (Addendum)
 Controlled BP with normal exam. Current regimen is amlodipine.  Swelling completely resolved after stopping Avapro. Will continue same medications; encourage continued reduced sodium diet.

## 2023-12-02 NOTE — Progress Notes (Signed)
 Date:  12/02/2023   Name:  Katherine Adkins   DOB:  1975-12-27   MRN:  [id number]   Chief Complaint: Rash (Pt has a red rash located under her neck, near chest area. She states it goes up to her forehead. Pt states its itchy, it burns and its sensitive to the touch. Pt state symptoms started mid December that started on her collar bone.)  Rash This is a new problem. The current episode started 1 to 4 weeks ago. The problem has been waxing and waning since onset. The affected locations include the neck and chest. The rash is characterized by itchiness and dryness. She was exposed to nothing. Pertinent negatives include no fatigue or shortness of breath. Past treatments include topical steroids and anti-itch cream. The treatment provided mild relief.  Hypertension This is a chronic problem. The problem is controlled. Pertinent negatives include no chest pain, headaches, palpitations or shortness of breath. Past treatments include calcium channel blockers. The current treatment provides significant improvement.    Review of Systems  Constitutional:  Negative for chills and fatigue.  Respiratory:  Negative for chest tightness and shortness of breath.   Cardiovascular:  Negative for chest pain, palpitations and leg swelling.  Skin:  Positive for rash.  Neurological:  Negative for headaches.     Lab Results  Component Value Date   NA 141 10/18/2023   K 4.0 10/18/2023   CO2 24 10/18/2023   GLUCOSE 101 (H) 10/18/2023   BUN 12 10/18/2023   CREATININE 0.86 10/18/2023   CALCIUM 9.7 10/18/2023   EGFR 83 10/18/2023   GFRNONAA 94 09/14/2019   Lab Results  Component Value Date   CHOL 140 10/18/2023   HDL 52 10/18/2023   LDLCALC 76 10/18/2023   TRIG 57 10/18/2023   CHOLHDL 2.7 10/18/2023   Lab Results  Component Value Date   TSH 1.860 10/18/2023   Lab Results  Component Value Date   HGBA1C 5.9 (H) 10/18/2023   Lab Results  Component Value Date   WBC 4.2 10/18/2023   HGB  13.3 10/18/2023   HCT 41.3 10/18/2023   MCV 87 10/18/2023   PLT 307 10/18/2023   Lab Results  Component Value Date   ALT 21 10/18/2023   AST 20 10/18/2023   ALKPHOS 107 10/18/2023   BILITOT 0.4 10/18/2023   No results found for: MARIEN BOLLS, VD25OH   Patient Active Problem List   Diagnosis Date Noted   Essential hypertension 11/10/2023   Mild intermittent asthma without complication 10/31/2018   Benign neoplasm of descending colon    Cannot sleep 11/05/2015   Low back strain 11/05/2015   Snores 11/05/2015    Allergies  Allergen Reactions   Irbesartan  Swelling and Rash    Past Surgical History:  Procedure Laterality Date   CESAREAN SECTION  2004, 2007   COLONOSCOPY  2007   Span[place]el - single benign polyp   COLONOSCOPY WITH PROPOFOL  N/A 05/31/2016   Procedure: COLONOSCOPY WITH PROPOFOL ;  Surgeon: Rogelia Copping, MD;  Location: [facility];  Service: Endoscopy;  Laterality: N/A;   POLYPECTOMY  05/31/2016   Procedure: POLYPECTOMY;  Surgeon: Rogelia Copping, MD;  Location: [facility];  Service: Endoscopy;;    Social History   Tobacco Use   Smoking status: Never   Smokeless tobacco: Never  Substance Use Topics   Alcohol use: No    Alcohol/week: 0.0 standard drinks of alcohol    Comment: 1 glass of wine per month   Drug  use: No     Medication list has been reviewed and updated.  Current Meds  Medication Sig   albuterol  (VENTOLIN  HFA) 108 (90 Base) MCG/ACT inhaler INHALE 2 PUFFS INTO THE LUNGS EVERY 4 HOURS AS NEEDED FOR WHEEZE OR FOR SHORTNESS OF BREATH   amLODipine  (NORVASC ) 5 MG tablet Take 1 tablet (5 mg total) by mouth daily.   Emollient (CETAPHIL) cream 1 Application daily as needed.   levonorgestrel (MIRENA) 20 MCG/DAY IUD 1 each by Intrauterine route once.   montelukast  (SINGULAIR ) 10 MG tablet TAKE 1 TABLET BY MOUTH EVERYDAY AT BEDTIME   Multiple Vitamins-Minerals (MULTIVITAMIN WITH MINERALS) tablet Take 1 tablet by mouth daily.    SYMBICORT  80-4.5 MCG/ACT inhaler INHALE 2 PUFFS INTO THE LUNGS 2 (TWO) TIMES DAILY. IN THE MORNING AND AT BEDTIME.   triamcinolone  ointment (KENALOG ) 0.5 % Apply 1 Application topically 2 (two) times daily. To rash on neck.  Do not use on face.   [DISCONTINUED] gabapentin (NEURONTIN) 100 MG capsule        12/02/2023   11:29 AM 11/10/2023   10:29 AM 10/18/2023    9:38 AM 01/08/2022   10:12 AM  GAD 7 : Generalized Anxiety Score  Nervous, Anxious, on Edge 0 0 0 0  Control/stop worrying 0 0 0 0  Worry too much - different things 0 0 0 0  Trouble relaxing 0 0 0 0  Restless 0 0 0 0  Easily annoyed or irritable 0 0 0 0  Afraid - awful might happen 0 0 0 0  Total GAD 7 Score 0 0 0 0  Anxiety Difficulty Not difficult at all Not difficult at all Not difficult at all Not difficult at all       12/02/2023   11:29 AM 11/10/2023   10:29 AM 10/18/2023    9:38 AM  Depression screen PHQ 2/9  Decreased Interest 0 0 0  Down, Depressed, Hopeless 0 0 0  PHQ - 2 Score 0 0 0  Altered sleeping 0 0 0  Tired, decreased energy 0 0 0  Change in appetite 0 0 0  Feeling bad or failure about yourself  0 0 0  Trouble concentrating 0 0 0  Moving slowly or fidgety/restless 0 0 0  Suicidal thoughts 0 0 0  PHQ-9 Score 0 0 0  Difficult doing work/chores Not difficult at all Not difficult at all Not difficult at all    BP Readings from Last 3 Encounters:  12/02/23 122/76  11/10/23 (!) 142/88  10/18/23 (!) 144/92    Physical Exam Vitals and nursing note reviewed.  Constitutional:      General: She is not in acute distress.    Appearance: Normal appearance. She is well-developed.  HENT:     Head: Normocephalic and atraumatic.  Cardiovascular:     Rate and Rhythm: Normal rate and regular rhythm.     Heart sounds: No murmur heard. Pulmonary:     Effort: Pulmonary effort is normal. No respiratory distress.     Breath sounds: No wheezing or rhonchi.  Musculoskeletal:     Cervical back: Normal range  of motion.  Skin:    General: Skin is warm and dry.     Findings: Rash present.          Comments: Fine pink confluent flat lesions on neck  Neurological:     Mental Status: She is alert and oriented to person, place, and time.  Psychiatric:        Mood and  Affect: Mood normal.        Behavior: Behavior normal.     Wt Readings from Last 3 Encounters:  12/02/23 253 lb (114.8 kg)  11/10/23 254 lb (115.2 kg)  10/18/23 255 lb (115.7 kg)    BP 122/76   Pulse 78   Ht 5' 7 (1.702 m)   Wt 253 lb (114.8 kg)   SpO2 97%   BMI 39.63 kg/m   Assessment and Plan:  Problem List Items Addressed This Visit       Unprioritized   Essential hypertension - Primary   Controlled BP with normal exam. Current regimen is amlodipine .  Swelling completely resolved after stopping Avapro . Will continue same medications; encourage continued reduced sodium diet.       Other Visit Diagnoses       Rash and other nonspecific skin eruption       unclear etiology will try Kenalog  ointment - if tolerated and benefit can continue to use can use moisturzing lotion other wise and on face   Relevant Medications   triamcinolone  ointment (KENALOG ) 0.5 %       No follow-ups on file.    Leita HILARIO Adie, MD [facility] [place]

## 2023-12-29 DIAGNOSIS — L218 Other seborrheic dermatitis: Secondary | ICD-10-CM | POA: Diagnosis not present

## 2024-01-03 ENCOUNTER — Ambulatory Visit: Payer: BC Managed Care – PPO | Admitting: Internal Medicine

## 2024-01-03 ENCOUNTER — Encounter: Payer: Self-pay | Admitting: Internal Medicine

## 2024-01-03 VITALS — BP 122/78 | HR 86 | Ht 67.0 in | Wt 249.0 lb

## 2024-01-03 DIAGNOSIS — I10 Essential (primary) hypertension: Secondary | ICD-10-CM

## 2024-01-03 DIAGNOSIS — M67471 Ganglion, right ankle and foot: Secondary | ICD-10-CM | POA: Diagnosis not present

## 2024-01-03 MED ORDER — AMLODIPINE BESYLATE 5 MG PO TABS: 5.0000 mg | ORAL_TABLET | Freq: Every day | ORAL | 1 refills | Status: DC

## 2024-01-03 NOTE — Progress Notes (Signed)
Date:  01/03/2024   Name:  Katherine Adkins   DOB:  Jun 21, 1976   MRN:  [id number]   Chief Complaint: Hypertension and Cyst (Top of right foot. Noticed this about 3 weeks ago. Growing. Tender.)  Hypertension This is a chronic problem. The problem is controlled. Pertinent negatives include no chest pain, palpitations or shortness of breath. Past treatments include calcium channel blockers. The current treatment provides significant improvement. There are no compliance problems.     Review of Systems  Constitutional:  Negative for chills and fatigue.  Respiratory:  Negative for chest tightness and shortness of breath.   Cardiovascular:  Negative for chest pain, palpitations and leg swelling.  Musculoskeletal:  Positive for arthralgias (right foot mass and pain).     Lab Results  Component Value Date   NA 141 10/18/2023   K 4.0 10/18/2023   CO2 24 10/18/2023   GLUCOSE 101 (H) 10/18/2023   BUN 12 10/18/2023   CREATININE 0.86 10/18/2023   CALCIUM 9.7 10/18/2023   EGFR 83 10/18/2023   GFRNONAA 94 09/14/2019   Lab Results  Component Value Date   CHOL 140 10/18/2023   HDL 52 10/18/2023   LDLCALC 76 10/18/2023   TRIG 57 10/18/2023   CHOLHDL 2.7 10/18/2023   Lab Results  Component Value Date   TSH 1.860 10/18/2023   Lab Results  Component Value Date   HGBA1C 5.9 (H) 10/18/2023   Lab Results  Component Value Date   WBC 4.2 10/18/2023   HGB 13.3 10/18/2023   HCT 41.3 10/18/2023   MCV 87 10/18/2023   PLT 307 10/18/2023   Lab Results  Component Value Date   ALT 21 10/18/2023   AST 20 10/18/2023   ALKPHOS 107 10/18/2023   BILITOT 0.4 10/18/2023   No results found for: "25OHVITD2", "25OHVITD3", "VD25OH"   Patient Active Problem List   Diagnosis Date Noted   Obesity, morbid (HCC) 01/03/2024   Essential hypertension 11/10/2023   Mild intermittent asthma without complication 10/31/2018   Benign neoplasm of descending colon    Cannot sleep 11/05/2015   Low  back strain 11/05/2015   Snores 11/05/2015    Allergies  Allergen Reactions   Irbesartan Swelling and Rash    Past Surgical History:  Procedure Laterality Date   CESAREAN SECTION  2004, 2007   COLONOSCOPY  2007   Span[place]el - single benign polyp   COLONOSCOPY WITH PROPOFOL N/A 05/31/2016   Procedure: COLONOSCOPY WITH PROPOFOL;  Surgeon: Midge Minium, MD;  Location: [facility];  Service: Endoscopy;  Laterality: N/A;   POLYPECTOMY  05/31/2016   Procedure: POLYPECTOMY;  Surgeon: Midge Minium, MD;  Location: [facility];  Service: Endoscopy;;    Social History   Tobacco Use   Smoking status: Never   Smokeless tobacco: Never  Substance Use Topics   Alcohol use: No    Alcohol/week: 0.0 standard drinks of alcohol    Comment: 1 glass of wine per month   Drug use: No     Medication list has been reviewed and updated.  Current Meds  Medication Sig   albuterol (VENTOLIN HFA) 108 (90 Base) MCG/ACT inhaler INHALE 2 PUFFS INTO THE LUNGS EVERY 4 HOURS AS NEEDED FOR WHEEZE OR FOR SHORTNESS OF BREATH   Emollient (CETAPHIL) cream 1 Application daily as needed.   levonorgestrel (MIRENA) 20 MCG/DAY IUD 1 each by Intrauterine route once.   montelukast (SINGULAIR) 10 MG tablet TAKE 1 TABLET BY MOUTH EVERYDAY AT BEDTIME   Multiple  Vitamins-Minerals (MULTIVITAMIN WITH MINERALS) tablet Take 1 tablet by mouth daily.   SYMBICORT 80-4.5 MCG/ACT inhaler INHALE 2 PUFFS INTO THE LUNGS 2 (TWO) TIMES DAILY. IN THE MORNING AND AT BEDTIME.   triamcinolone ointment (KENALOG) 0.5 % Apply 1 Application topically 2 (two) times daily. To rash on neck.  Do not use on face.   [DISCONTINUED] amLODipine (NORVASC) 5 MG tablet Take 1 tablet (5 mg total) by mouth daily.       01/03/2024    3:01 PM 12/02/2023   11:29 AM 11/10/2023   10:29 AM 10/18/2023    9:38 AM  GAD 7 : Generalized Anxiety Score  Nervous, Anxious, on Edge 1 0 0 0  Control/stop worrying 0 0 0 0  Worry too much - different things 1  0 0 0  Trouble relaxing 0 0 0 0  Restless 0 0 0 0  Easily annoyed or irritable 0 0 0 0  Afraid - awful might happen 0 0 0 0  Total GAD 7 Score 2 0 0 0  Anxiety Difficulty Not difficult at all Not difficult at all Not difficult at all Not difficult at all       01/03/2024    3:01 PM 12/02/2023   11:29 AM 11/10/2023   10:29 AM  Depression screen PHQ 2/9  Decreased Interest 0 0 0  Down, Depressed, Hopeless 0 0 0  PHQ - 2 Score 0 0 0  Altered sleeping 1 0 0  Tired, decreased energy 1 0 0  Change in appetite 0 0 0  Feeling bad or failure about yourself  0 0 0  Trouble concentrating 0 0 0  Moving slowly or fidgety/restless 0 0 0  Suicidal thoughts 0 0 0  PHQ-9 Score 2 0 0  Difficult doing work/chores Not difficult at all Not difficult at all Not difficult at all    BP Readings from Last 3 Encounters:  01/03/24 122/78  12/02/23 122/76  11/10/23 (!) 142/88    Physical Exam Vitals and nursing note reviewed.  Constitutional:      General: She is not in acute distress.    Appearance: Normal appearance. She is well-developed.  HENT:     Head: Normocephalic and atraumatic.  Cardiovascular:     Rate and Rhythm: Normal rate and regular rhythm.  Pulmonary:     Effort: Pulmonary effort is normal. No respiratory distress.     Breath sounds: No wheezing or rhonchi.  Musculoskeletal:     Cervical back: Normal range of motion.       Feet:  Feet:     Comments: 1 cm mobile soft smooth cystic mass c/w ganglion dorsum of right foot Skin:    General: Skin is warm and dry.     Findings: No rash.  Neurological:     Mental Status: She is alert and oriented to person, place, and time.  Psychiatric:        Mood and Affect: Mood normal.        Behavior: Behavior normal.     Wt Readings from Last 3 Encounters:  01/03/24 249 lb (112.9 kg)  12/02/23 253 lb (114.8 kg)  11/10/23 254 lb (115.2 kg)    BP 122/78   Pulse 86   Ht 5\' 7"  (1.702 m)   Wt 249 lb (112.9 kg)   SpO2 96%   BMI  39.00 kg/m   Assessment and Plan:  Problem List Items Addressed This Visit       Unprioritized   Essential  hypertension - Primary   Controlled BP with normal exam. Current regimen is amlodipine 5 mg. Will continue same medications; encourage continued reduced sodium diet.       Relevant Medications   amLODipine (NORVASC) 5 MG tablet   Obesity, morbid (HCC)   Other Visit Diagnoses       Ganglion cyst of right foot       Relevant Orders   Ambulatory referral to Podiatry       Return for CPX.    Reubin Milan, MD [facility] [place]

## 2024-01-03 NOTE — Assessment & Plan Note (Signed)
Controlled BP with normal exam. Current regimen is amlodipine 5 mg. Will continue same medications; encourage continued reduced sodium diet.

## 2024-01-09 ENCOUNTER — Ambulatory Visit: Payer: BC Managed Care – PPO | Admitting: Podiatry

## 2024-01-09 DIAGNOSIS — M67971 Unspecified disorder of synovium and tendon, right ankle and foot: Secondary | ICD-10-CM | POA: Diagnosis not present

## 2024-01-09 DIAGNOSIS — M67471 Ganglion, right ankle and foot: Secondary | ICD-10-CM

## 2024-01-09 DIAGNOSIS — R600 Localized edema: Secondary | ICD-10-CM

## 2024-01-09 NOTE — Progress Notes (Unsigned)
 Chief Complaint  Patient presents with   Ganglion Cyst    Right foot cyst on top of foot pt stated that she has been dealing with off and on since 2021 she stated that it will go down and then come back she stated that the last month and a half has been the worse she stated that she will sometimes take advil for the pain which is helpful    HPI: 48 y.o. female presents today with concern of a possible cyst on the dorsal aspect of the right midfoot.  States that she saw her PCP recently and they informed her they felt this was most likely a ganglion cyst.  They are not already discussed the possibility of needing to have it aspirated.  And patient has not had one in this area previously.  Past Medical History:  Diagnosis Date   Anemia    Anxiety    Asthma     Past Surgical History:  Procedure Laterality Date   CESAREAN SECTION  2004, 2007   COLONOSCOPY  2007   [place] - single benign polyp   COLONOSCOPY WITH PROPOFOL N/A 05/31/2016   Procedure: COLONOSCOPY WITH PROPOFOL;  Surgeon: Midge Minium, MD;  Location: [facility];  Service: Endoscopy;  Laterality: N/A;   POLYPECTOMY  05/31/2016   Procedure: POLYPECTOMY;  Surgeon: Midge Minium, MD;  Location: [facility];  Service: Endoscopy;;    Allergies  Allergen Reactions   Irbesartan Swelling and Rash    Physical Exam: Palpable pedal pulses noted to the right foot.  There is a fairly well-defined raised mass which is fluctuant in nature on the central dorsal aspect of the right midfoot.  This is not pulsatile.  There is no discoloration in the area.  When pushing on the there is another area just distal to this that begins to fill and become slightly on it.  No pain with range of motion of the midtarsal joint.  Epicritic sensation intact  Assessment/Plan of Care: 1. Ganglion cyst of right foot   2. Localized edema    Discussed clinical findings with patient today.  Informed the patient that this is most likely a  ganglion cyst.  With the patient's consent and after a sterile skin prep, the mass was anesthetized utilizing 1% lidocaine plain.  Following confirmation of anesthesia an 18-gauge needle attached to a syringe was inserted into the cyst.  Approximately 1.5 cc of thick clear gelatinous fluid with a slight tinge of yellow aspirated.  Once no more aspirate could be obtained the area was administered a cortisone injection consisting of 1% lidocaine plain and Kenalog 10.  Light gauze bandage and Band-Aid were applied.  Patient will keep this covered for the next 24 to 36 hours.  The aspirate will be sent to [facility] labs for identification.  Discussed high rate of recurrence of ganglion cyst.  And for the patient that if this continues or continues to provide any pain we can surgically excise the mass.  Follow-up in 3 to 4 weeks for recheck.   Clerance Lav, DPM, FACFAS [facility]     [address], [place] [address]  Office [phone]  Fax [phone]

## 2024-01-14 ENCOUNTER — Other Ambulatory Visit: Payer: Self-pay | Admitting: Internal Medicine

## 2024-01-14 DIAGNOSIS — R030 Elevated blood-pressure reading, without diagnosis of hypertension: Secondary | ICD-10-CM

## 2024-01-16 ENCOUNTER — Ambulatory Visit: Payer: Self-pay | Admitting: Podiatry

## 2024-01-16 NOTE — Telephone Encounter (Signed)
 Unable to refill per protocol, Rx expired. Discontinued on 11/10/23.  Requested Prescriptions  Pending Prescriptions Disp Refills   irbesartan (AVAPRO) 150 MG tablet [Pharmacy Med Name: IRBESARTAN 150 MG TABLET] 90 tablet 0    Sig: TAKE 1 TABLET BY MOUTH EVERY DAY     Cardiovascular:  Angiotensin Receptor Blockers Passed - 01/16/2024  1:39 PM      Passed - Cr in normal range and within 180 days    Creatinine, Ser  Date Value Ref Range Status  10/18/2023 0.86 0.57 - 1.00 mg/dL Final         Passed - K in normal range and within 180 days    Potassium  Date Value Ref Range Status  10/18/2023 4.0 3.5 - 5.2 mmol/L Final         Passed - Patient is not pregnant      Passed - Last BP in normal range    BP Readings from Last 1 Encounters:  01/03/24 122/78         Passed - Valid encounter within last 6 months    Recent Outpatient Visits           1 month ago Essential hypertension   [place] [facility] at [facility], Nyoka Cowden, MD   2 months ago Essential hypertension   [place] [facility] at [facility], Nyoka Cowden, MD   3 months ago Annual physical exam   [facility] at [facility], Nyoka Cowden, MD   2 years ago Annual physical exam   [facility] at [facility], Nyoka Cowden, MD   2 years ago COVID-19 virus infection   [facility] at [facility], Nyoka Cowden, MD

## 2024-01-23 ENCOUNTER — Other Ambulatory Visit: Payer: Self-pay | Admitting: Podiatry

## 2024-01-30 ENCOUNTER — Encounter: Payer: Self-pay | Admitting: Podiatry

## 2024-02-06 ENCOUNTER — Ambulatory Visit: Payer: BC Managed Care – PPO | Admitting: Podiatry

## 2024-02-09 ENCOUNTER — Ambulatory Visit (INDEPENDENT_AMBULATORY_CARE_PROVIDER_SITE_OTHER)

## 2024-02-09 ENCOUNTER — Ambulatory Visit: Admitting: Podiatry

## 2024-02-09 ENCOUNTER — Encounter: Payer: Self-pay | Admitting: Podiatry

## 2024-02-09 DIAGNOSIS — M67471 Ganglion, right ankle and foot: Secondary | ICD-10-CM

## 2024-02-09 DIAGNOSIS — M25571 Pain in right ankle and joints of right foot: Secondary | ICD-10-CM | POA: Diagnosis not present

## 2024-02-09 DIAGNOSIS — M2141 Flat foot [pes planus] (acquired), right foot: Secondary | ICD-10-CM

## 2024-02-09 DIAGNOSIS — M67472 Ganglion, left ankle and foot: Secondary | ICD-10-CM

## 2024-02-09 DIAGNOSIS — M2142 Flat foot [pes planus] (acquired), left foot: Secondary | ICD-10-CM | POA: Diagnosis not present

## 2024-02-09 MED ORDER — MELOXICAM 15 MG PO TABS
15.0000 mg | ORAL_TABLET | Freq: Every day | ORAL | 0 refills | Status: AC
Start: 2024-02-09 — End: ?

## 2024-02-09 MED ORDER — METHYLPREDNISOLONE 4 MG PO TBPK: ORAL_TABLET | ORAL | 0 refills | Status: DC

## 2024-02-09 NOTE — Progress Notes (Signed)
 Chief Complaint  Patient presents with   Ganglion Cyst    rm 16: XR if recurrence 4 week chk ganglion cyst right foot if having pain. Pt says pain persists bilateral feet.    HPI: 48 y.o. female presents today with complaint of right lateral foot and hindfoot pain.  She had ganglion cyst aspirated with Dr. Burna Mortimer about one month ago to right dorsal midfoot.  She reports some residual swelling in this area but pain has improved.  Past Medical History:  Diagnosis Date   Anemia    Anxiety    Asthma     Past Surgical History:  Procedure Laterality Date   CESAREAN SECTION  2004, 2007   COLONOSCOPY  2007   [place] - single benign polyp   COLONOSCOPY WITH PROPOFOL N/A 05/31/2016   Procedure: COLONOSCOPY WITH PROPOFOL;  Surgeon: Midge Minium, MD;  Location: [facility];  Service: Endoscopy;  Laterality: N/A;   POLYPECTOMY  05/31/2016   Procedure: POLYPECTOMY;  Surgeon: Midge Minium, MD;  Location: [facility];  Service: Endoscopy;;    Allergies  Allergen Reactions   Irbesartan Swelling and Rash    ROS    Physical Exam: There were no vitals filed for this visit.  General: The patient is alert and oriented x3 in no acute distress.  Dermatology: Skin is warm, dry and supple bilateral lower extremities. Interspaces are clear of maceration and debris.    Vascular: Palpable pedal pulses bilaterally. Capillary refill within normal limits.  No appreciable edema.  No erythema or calor.  Neurological: Light touch sensation grossly intact bilateral feet.   Musculoskeletal Exam: Pes planus foot type noted bilaterally.  There is some tenderness on palpation of the right sinus tarsi and of the right lateral hindfoot and midfoot.  No strength deficits noted right foot.  No symptomatic limitations in pedal range of motion. Some localized tenderness and mild edema over EHB and EDB muscle bellies right foot.  Radiographic Exam: Left and right foot radiographs 3 views  02/09/2024 Normal osseous mineralization. Joint spaces preserved.  No fractures or acute osseous irregularities noted.  Pes planus foot type noted bilaterally  Assessment/Plan of Care: 1. Sinus tarsi syndrome of right ankle   2. Pes planus of both feet      Meds ordered this encounter  Medications   methylPREDNISolone (MEDROL DOSEPAK) 4 MG TBPK tablet    Sig: 6 Day Tapering Dose    Dispense:  21 tablet    Refill:  0   meloxicam (MOBIC) 15 MG tablet    Sig: Take 1 tablet (15 mg total) by mouth daily.    Dispense:  30 tablet    Refill:  0   None  Discussed clinical findings with patient today.  Radiographs discussed with patient.  Rx sent for Medrol Dosepak followed by course of oral meloxicam.  We discussed symptoms of sinus tarsi syndrome versus lateral column overload.  We did consider corticosteroid injection, decided to defer this as patient recently saw Dr. Burna Mortimer for ganglion cyst of right foot and is more familiar with patient overall.  Today cam boot was dispensed for cam boot immobilization with RICE protocol.  Will have patient follow-up in 3 to 4 weeks with Dr. Burna Mortimer, may feel steroid injection is warranted at this time.   Anallely Rosell L. Marchia Bond, AACFAS [facility]     [address]  [place], [place] [address]                Office [phone]  Fax [phone]

## 2024-03-05 ENCOUNTER — Encounter: Admitting: Podiatry

## 2024-03-08 ENCOUNTER — Ambulatory Visit
Admission: EM | Admit: 2024-03-08 | Discharge: 2024-03-08 | Disposition: A | Discharge status: Home / Self Care | Attending: Family Medicine | Admitting: Family Medicine

## 2024-03-08 ENCOUNTER — Ambulatory Visit (INDEPENDENT_AMBULATORY_CARE_PROVIDER_SITE_OTHER)

## 2024-03-08 DIAGNOSIS — J4541 Moderate persistent asthma with (acute) exacerbation: Secondary | ICD-10-CM | POA: Diagnosis not present

## 2024-03-08 DIAGNOSIS — R0789 Other chest pain: Secondary | ICD-10-CM | POA: Diagnosis not present

## 2024-03-08 DIAGNOSIS — R0989 Other specified symptoms and signs involving the circulatory and respiratory systems: Secondary | ICD-10-CM | POA: Diagnosis not present

## 2024-03-08 DIAGNOSIS — J4520 Mild intermittent asthma, uncomplicated: Secondary | ICD-10-CM

## 2024-03-08 MED ORDER — AZELASTINE HCL 0.1 % NA SOLN: 1.0000 | Freq: Two times a day (BID) | NASAL | 1 refills | Status: AC

## 2024-03-08 MED ORDER — FEXOFENADINE HCL 180 MG PO TABS: 180.0000 mg | ORAL_TABLET | Freq: Every day | ORAL | 1 refills | Status: DC

## 2024-03-08 MED ORDER — ALBUTEROL SULFATE HFA 108 (90 BASE) MCG/ACT IN AERS: INHALATION_SPRAY | RESPIRATORY_TRACT | 2 refills | Status: DC

## 2024-03-08 MED ORDER — PREDNISONE 20 MG PO TABS: 40.0000 mg | ORAL_TABLET | Freq: Every day | ORAL | 0 refills | Status: AC

## 2024-03-08 NOTE — Progress Notes (Signed)
 Patient did not show for scheduled appointment on 03/05/2024

## 2024-03-08 NOTE — ED Triage Notes (Signed)
 Patietn presents to UC for chest tightness x 2 weeks ago. Had a cold states she had noticed some improvement. Her symptoms worsened 2 days ago. Treating her symptoms with her rescue and daily inhaler, montelukast. Hx of asthma.   Denies fever.

## 2024-03-08 NOTE — ED Provider Notes (Signed)
 [facility]  Note:  This document was prepared using Conservation officer, historic buildings and may include unintentional dictation errors.  MRN: [id number] DOB: 01-07-1976  Subjective:   Katherine Adkins is a 48 y.o. female presenting for intermittent wheezing, cough, chest congestion x 2 weeks.  Patient has been using albuterol inhaler and montelukast for history of asthma with minimal improvement to symptoms.  Patient has also been using a Nettie pot for sinusitis.  Patient denies fever, ear pain, body aches, fatigue.  No shortness of breath, chest pain, weakness, dizziness.  No current facility-administered medications for this encounter.  Current Outpatient Medications:    azelastine (ASTELIN) 0.1 % nasal spray, Place 1 spray into both nostrils 2 (two) times daily. Use in each nostril as directed, Disp: 30 mL, Rfl: 1   fexofenadine (ALLEGRA) 180 MG tablet, Take 1 tablet (180 mg total) by mouth daily., Disp: 60 tablet, Rfl: 1   predniSONE (DELTASONE) 20 MG tablet, Take 2 tablets (40 mg total) by mouth daily for 5 days., Disp: 10 tablet, Rfl: 0   albuterol (VENTOLIN HFA) 108 (90 Base) MCG/ACT inhaler, INHALE 2 PUFFS INTO THE LUNGS EVERY 4 HOURS AS NEEDED FOR WHEEZE OR FOR SHORTNESS OF BREATH, Disp: 8.5 each, Rfl: 2   amLODipine (NORVASC) 5 MG tablet, Take 1 tablet (5 mg total) by mouth daily., Disp: 90 tablet, Rfl: 1   Emollient (CETAPHIL) cream, 1 Application daily as needed., Disp: , Rfl:    levonorgestrel (MIRENA) 20 MCG/DAY IUD, 1 each by Intrauterine route once., Disp: , Rfl:    meloxicam (MOBIC) 15 MG tablet, Take 1 tablet (15 mg total) by mouth daily., Disp: 30 tablet, Rfl: 0   montelukast (SINGULAIR) 10 MG tablet, TAKE 1 TABLET BY MOUTH EVERYDAY AT BEDTIME, Disp: 90 tablet, Rfl: 1   Multiple Vitamins-Minerals (MULTIVITAMIN WITH MINERALS) tablet, Take 1 tablet by mouth daily., Disp: , Rfl:    SYMBICORT 80-4.5 MCG/ACT inhaler, INHALE 2 PUFFS INTO THE LUNGS 2 (TWO) TIMES  DAILY. IN THE MORNING AND AT BEDTIME., Disp: 10.2 each, Rfl: 5   triamcinolone ointment (KENALOG) 0.5 %, Apply 1 Application topically 2 (two) times daily. To rash on neck.  Do not use on face., Disp: 15 g, Rfl: 0   Allergies  Allergen Reactions   Irbesartan Swelling and Rash    Past Medical History:  Diagnosis Date   Anemia    Anxiety    Asthma      Past Surgical History:  Procedure Laterality Date   CESAREAN SECTION  2004, 2007   COLONOSCOPY  2007   [place] - single benign polyp   COLONOSCOPY WITH PROPOFOL N/A 05/31/2016   Procedure: COLONOSCOPY WITH PROPOFOL;  Surgeon: Midge Minium, MD;  Location: [facility];  Service: Endoscopy;  Laterality: N/A;   POLYPECTOMY  05/31/2016   Procedure: POLYPECTOMY;  Surgeon: Midge Minium, MD;  Location: [facility];  Service: Endoscopy;;    Family History  Adopted: Yes    Social History   Tobacco Use   Smoking status: Never   Smokeless tobacco: Never  Substance Use Topics   Alcohol use: No    Alcohol/week: 0.0 standard drinks of alcohol    Comment: 1 glass of wine per month   Drug use: No    ROS Refer to HPI for ROS details.  Objective:   Vitals: BP 122/70 (BP Location: Right Arm)   Pulse 79   Temp 98.4 F (36.9 C) (Oral)   Resp 16   SpO2 98%  Physical Exam Vitals and nursing note reviewed.  Constitutional:      General: She is not in acute distress.    Appearance: She is well-developed. She is not ill-appearing or toxic-appearing.  HENT:     Head: Normocephalic and atraumatic.     Nose: Congestion and rhinorrhea present.     Mouth/Throat:     Mouth: Mucous membranes are moist.     Pharynx: Oropharynx is clear. No oropharyngeal exudate or posterior oropharyngeal erythema.  Cardiovascular:     Rate and Rhythm: Normal rate and regular rhythm.     Heart sounds: No murmur heard. Pulmonary:     Effort: Pulmonary effort is normal. No respiratory distress.     Breath sounds: Normal breath sounds. No  stridor. No wheezing, rhonchi or rales.  Chest:     Chest wall: No tenderness.  Musculoskeletal:        General: Normal range of motion.  Skin:    General: Skin is warm and dry.  Neurological:     General: No focal deficit present.     Mental Status: She is alert and oriented to person, place, and time.  Psychiatric:        Mood and Affect: Mood normal.        Behavior: Behavior normal.     Procedures  No results found for this or any previous visit (from the past 24 hours).  Assessment and Plan :     Discharge Instructions      1. Moderate persistent asthma with acute exacerbation in adult (Primary) - DG Chest 2 View x-ray performed in UC shows no acute cardiopulmonary processes, no sign of consolidation or pneumonia. - albuterol (VENTOLIN HFA) 108 (90 Base) MCG/ACT inhaler; INHALE 2 PUFFS INTO THE LUNGS EVERY 4 HOURS AS NEEDED FOR WHEEZE OR FOR SHORTNESS OF BREATH  Dispense: 8.5 each; Refill: 2 - predniSONE (DELTASONE) 20 MG tablet; Take 2 tablets (40 mg total) by mouth daily for 5 days.  Dispense: 10 tablet; Refill: 0 - fexofenadine (ALLEGRA) 180 MG tablet; Take 1 tablet (180 mg total) by mouth daily.  Dispense: 60 tablet; Refill: 1 - azelastine (ASTELIN) 0.1 % nasal spray; Place 1 spray into both nostrils 2 (two) times daily. Use in each nostril as directed  Dispense: 30 mL; Refill: 1 -Continue to monitor symptoms for any change in severity if there is any escalation of current symptoms or development of new symptoms follow-up in ER for further evaluation and management.      Oakley Kossman B Suzie Vandam   Mailee Klaas, [place] B, [place] 03/08/24 [phone]

## 2024-03-08 NOTE — Discharge Instructions (Signed)
 1. Moderate persistent asthma with acute exacerbation in adult (Primary) - DG Chest 2 View x-ray performed in UC shows no acute cardiopulmonary processes, no sign of consolidation or pneumonia. - albuterol (VENTOLIN HFA) 108 (90 Base) MCG/ACT inhaler; INHALE 2 PUFFS INTO THE LUNGS EVERY 4 HOURS AS NEEDED FOR WHEEZE OR FOR SHORTNESS OF BREATH  Dispense: 8.5 each; Refill: 2 - predniSONE (DELTASONE) 20 MG tablet; Take 2 tablets (40 mg total) by mouth daily for 5 days.  Dispense: 10 tablet; Refill: 0 - fexofenadine (ALLEGRA) 180 MG tablet; Take 1 tablet (180 mg total) by mouth daily.  Dispense: 60 tablet; Refill: 1 - azelastine (ASTELIN) 0.1 % nasal spray; Place 1 spray into both nostrils 2 (two) times daily. Use in each nostril as directed  Dispense: 30 mL; Refill: 1 -Continue to monitor symptoms for any change in severity if there is any escalation of current symptoms or development of new symptoms follow-up in ER for further evaluation and management.

## 2024-03-23 ENCOUNTER — Ambulatory Visit: Admitting: Internal Medicine

## 2024-03-23 VITALS — BP 136/78 | HR 83 | Ht 67.0 in | Wt 246.0 lb

## 2024-03-23 DIAGNOSIS — R21 Rash and other nonspecific skin eruption: Secondary | ICD-10-CM | POA: Insufficient documentation

## 2024-03-23 DIAGNOSIS — M62838 Other muscle spasm: Secondary | ICD-10-CM

## 2024-03-23 DIAGNOSIS — R7303 Prediabetes: Secondary | ICD-10-CM | POA: Diagnosis not present

## 2024-03-23 LAB — POCT GLYCOSYLATED HEMOGLOBIN (HGB A1C): Hemoglobin A1C: 5.5 % (ref 4.0–5.6)

## 2024-03-23 MED ORDER — IBUPROFEN 800 MG PO TABS: 800.0000 mg | ORAL_TABLET | Freq: Three times a day (TID) | ORAL | 0 refills | Status: AC | PRN

## 2024-03-23 MED ORDER — CYCLOBENZAPRINE HCL 10 MG PO TABS: 10.0000 mg | ORAL_TABLET | Freq: Three times a day (TID) | ORAL | 0 refills | Status: AC | PRN

## 2024-03-23 NOTE — Assessment & Plan Note (Signed)
 She concerned that the rash is from diabetes She was borderline elevated last check Today A1C = 5.5

## 2024-03-23 NOTE — Progress Notes (Signed)
 Date:  03/23/2024   Name:  Katherine Adkins   DOB:  01-26-1976   MRN:  [id number]   Chief Complaint: Neck Pain (Patient said she may have slept wrong, neck pain 10/10, took ibuprofen  ) and Rash  Neck Pain  This is a new problem. The current episode started yesterday. The problem occurs constantly. The problem has been unchanged. The pain is present in the left side. The quality of the pain is described as aching and cramping. Pertinent negatives include no chest pain, fever, numbness or weakness. She has tried NSAIDs and heat for the symptoms. The treatment provided no relief.  Rash This is a chronic problem. Episode onset: about three months ago. The affected locations include the neck. The rash is characterized by itchiness, redness and dryness. Pertinent negatives include no fatigue, fever or shortness of breath. Treatments tried: Fluconazole tabs, steroid cream and antifungal cream. The treatment provided no relief.    Review of Systems  Constitutional:  Negative for chills, fatigue and fever.  Respiratory:  Negative for chest tightness and shortness of breath.   Cardiovascular:  Negative for chest pain and palpitations.  Musculoskeletal:  Positive for neck pain.  Skin:  Positive for rash.  Neurological:  Negative for dizziness, weakness and numbness.  Psychiatric/Behavioral:  Positive for sleep disturbance. Negative for dysphoric mood. The patient is not nervous/anxious.      Lab Results  Component Value Date   NA 141 10/18/2023   K 4.0 10/18/2023   CO2 24 10/18/2023   GLUCOSE 101 (H) 10/18/2023   BUN 12 10/18/2023   CREATININE 0.86 10/18/2023   CALCIUM 9.7 10/18/2023   EGFR 83 10/18/2023   GFRNONAA 94 09/14/2019   Lab Results  Component Value Date   CHOL 140 10/18/2023   HDL 52 10/18/2023   LDLCALC 76 10/18/2023   TRIG 57 10/18/2023   CHOLHDL 2.7 10/18/2023   Lab Results  Component Value Date   TSH 1.860 10/18/2023   Lab Results  Component Value Date    HGBA1C 5.5 03/23/2024   Lab Results  Component Value Date   WBC 4.2 10/18/2023   HGB 13.3 10/18/2023   HCT 41.3 10/18/2023   MCV 87 10/18/2023   PLT 307 10/18/2023   Lab Results  Component Value Date   ALT 21 10/18/2023   AST 20 10/18/2023   ALKPHOS 107 10/18/2023   BILITOT 0.4 10/18/2023   No results found for: "25OHVITD2", "25OHVITD3", "VD25OH"   Patient Active Problem List   Diagnosis Date Noted   Prediabetes 03/23/2024   Rash and nonspecific skin eruption 03/23/2024   Obesity, morbid (HCC) 01/03/2024   Essential hypertension 11/10/2023   Mild intermittent asthma without complication 10/31/2018   Benign neoplasm of descending colon    Cannot sleep 11/05/2015   Low back strain 11/05/2015   Snores 11/05/2015    Allergies  Allergen Reactions   Irbesartan  Swelling and Rash    Past Surgical History:  Procedure Laterality Date   CESAREAN SECTION  2004, 2007   COLONOSCOPY  11/22/2005   Span[place]el - single benign polyp   COLONOSCOPY WITH PROPOFOL  N/A 05/31/2016   Procedure: COLONOSCOPY WITH PROPOFOL ;  Surgeon: Marnee Sink, MD;  Location: [facility];  Service: Endoscopy;  Laterality: N/A;   LAPAROSCOPIC ABLATION OF UTERINE FIBROIDS WITH ACESSA  2024   POLYPECTOMY  05/31/2016   Procedure: POLYPECTOMY;  Surgeon: Marnee Sink, MD;  Location: [facility];  Service: Endoscopy;;    Social History   Tobacco  Use   Smoking status: Never   Smokeless tobacco: Never  Substance Use Topics   Alcohol use: No    Alcohol/week: 0.0 standard drinks of alcohol    Comment: 1 glass of wine per month   Drug use: No     Medication list has been reviewed and updated.  Current Meds  Medication Sig   albuterol  (VENTOLIN  HFA) 108 (90 Base) MCG/ACT inhaler INHALE 2 PUFFS INTO THE LUNGS EVERY 4 HOURS AS NEEDED FOR WHEEZE OR FOR SHORTNESS OF BREATH   amLODipine  (NORVASC ) 5 MG tablet Take 1 tablet (5 mg total) by mouth daily.   azelastine  (ASTELIN ) 0.1 % nasal spray Place  1 spray into both nostrils 2 (two) times daily. Use in each nostril as directed   cyclobenzaprine (FLEXERIL) 10 MG tablet Take 1 tablet (10 mg total) by mouth 3 (three) times daily as needed for muscle spasms.   Emollient (CETAPHIL) cream 1 Application daily as needed.   fexofenadine  (ALLEGRA ) 180 MG tablet Take 1 tablet (180 mg total) by mouth daily.   ibuprofen (ADVIL) 800 MG tablet Take 1 tablet (800 mg total) by mouth every 8 (eight) hours as needed.   levonorgestrel (MIRENA) 20 MCG/DAY IUD 1 each by Intrauterine route once.   meloxicam  (MOBIC ) 15 MG tablet Take 1 tablet (15 mg total) by mouth daily.   montelukast  (SINGULAIR ) 10 MG tablet TAKE 1 TABLET BY MOUTH EVERYDAY AT BEDTIME   Multiple Vitamins-Minerals (MULTIVITAMIN WITH MINERALS) tablet Take 1 tablet by mouth daily.   SYMBICORT  80-4.5 MCG/ACT inhaler INHALE 2 PUFFS INTO THE LUNGS 2 (TWO) TIMES DAILY. IN THE MORNING AND AT BEDTIME.       03/23/2024   11:08 AM 01/03/2024    3:01 PM 12/02/2023   11:29 AM 11/10/2023   10:29 AM  GAD 7 : Generalized Anxiety Score  Nervous, Anxious, on Edge 0 1 0 0  Control/stop worrying 0 0 0 0  Worry too much - different things 0 1 0 0  Trouble relaxing 0 0 0 0  Restless 0 0 0 0  Easily annoyed or irritable 0 0 0 0  Afraid - awful might happen 0 0 0 0  Total GAD 7 Score 0 2 0 0  Anxiety Difficulty Not difficult at all Not difficult at all Not difficult at all Not difficult at all       03/23/2024   11:08 AM 03/23/2024   10:35 AM 01/03/2024    3:01 PM  Depression screen PHQ 2/9  Decreased Interest 0 0 0  Down, Depressed, Hopeless 0 0 0  PHQ - 2 Score 0 0 0  Altered sleeping 0  1  Tired, decreased energy 0  1  Change in appetite 0  0  Feeling bad or failure about yourself  0  0  Trouble concentrating 0  0  Moving slowly or fidgety/restless 0  0  Suicidal thoughts 0  0  PHQ-9 Score 0  2  Difficult doing work/chores Not difficult at all  Not difficult at all    BP Readings from Last 3  Encounters:  03/23/24 136/78  03/08/24 122/70  01/03/24 122/78    Physical Exam Vitals and nursing note reviewed.  Constitutional:      General: She is not in acute distress.    Appearance: Normal appearance. She is well-developed.  HENT:     Head: Normocephalic and atraumatic.  Cardiovascular:     Rate and Rhythm: Normal rate and regular rhythm.  Pulmonary:  Effort: Pulmonary effort is normal. No respiratory distress.     Breath sounds: No wheezing or rhonchi.  Musculoskeletal:     Cervical back: Spasms and tenderness present. Pain with movement present.  Lymphadenopathy:     Cervical: No cervical adenopathy.  Skin:    General: Skin is warm and dry.     Findings: No rash.  Neurological:     Mental Status: She is alert and oriented to person, place, and time.  Psychiatric:        Mood and Affect: Mood normal.        Behavior: Behavior normal.     Wt Readings from Last 3 Encounters:  03/23/24 246 lb (111.6 kg)  01/03/24 249 lb (112.9 kg)  12/02/23 253 lb (114.8 kg)    BP 136/78   Pulse 83   Ht 5\' 7"  (1.702 m)   Wt 246 lb (111.6 kg)   LMP  (Approximate)   SpO2 98%   BMI 38.53 kg/m   Assessment and Plan:  Problem List Items Addressed This Visit       Unprioritized   Prediabetes   She concerned that the rash is from diabetes She was borderline elevated last check Today A1C = 5.5      Relevant Orders   POCT glycosylated hemoglobin (Hb A1C) (Completed)   Rash and nonspecific skin eruption   She has seen Dr. Tyrone Gallop but had no benefit from diflucan, mometasone and ketoconazole cream. I recommend that she follow up with Dr. Tyrone Gallop.      Other Visit Diagnoses       Cervical paraspinal muscle spasm    -  Primary   continue heat or ice - whichever it most helpful Advil 800 mg with Flexeril 10 mg tid   Relevant Medications   cyclobenzaprine (FLEXERIL) 10 MG tablet   ibuprofen (ADVIL) 800 MG tablet       No follow-ups on file.    Sheron Dixons, MD [facility] [place]

## 2024-03-23 NOTE — Assessment & Plan Note (Signed)
 She has seen Dr. Tyrone Gallop but had no benefit from diflucan, mometasone and ketoconazole cream. I recommend that she follow up with Dr. Tyrone Gallop.

## 2024-04-12 ENCOUNTER — Ambulatory Visit
Admission: EM | Admit: 2024-04-12 | Discharge: 2024-04-12 | Disposition: A | Discharge status: Home / Self Care | Attending: Emergency Medicine | Admitting: Emergency Medicine

## 2024-04-12 ENCOUNTER — Encounter: Payer: Self-pay | Admitting: Emergency Medicine

## 2024-04-12 DIAGNOSIS — M5010 Cervical disc disorder with radiculopathy, unspecified cervical region: Secondary | ICD-10-CM

## 2024-04-12 DIAGNOSIS — M5414 Radiculopathy, thoracic region: Secondary | ICD-10-CM

## 2024-04-12 MED ORDER — DEXAMETHASONE SODIUM PHOSPHATE 10 MG/ML IJ SOLN
10.0000 mg | Freq: Once | INTRAMUSCULAR | Status: AC
  Administered 2024-04-12: 10 mg via INTRAMUSCULAR

## 2024-04-12 MED ORDER — PREDNISONE 10 MG (21) PO TBPK: ORAL_TABLET | ORAL | 0 refills | Status: DC

## 2024-04-12 MED ORDER — BACLOFEN 10 MG PO TABS: 10.0000 mg | ORAL_TABLET | Freq: Three times a day (TID) | ORAL | 0 refills | Status: AC

## 2024-04-12 NOTE — ED Triage Notes (Addendum)
 Pt presents with left side neck and shoulder pain x 1 month. Pt denies any injury. The past week she has developed tingling in her left fingertips.   She was seen by her PCP when her symptoms first started and was prescribed Flexeril  and ibuprofen .

## 2024-04-12 NOTE — Discharge Instructions (Addendum)
 Take the prednisone  according to the package instructions.  Start tomorrow morning at breakfast.  You will take a decreasing dose each morning at breakfast over the next 6 days.  Take the baclofen, 10 mg every 8 hours, on a schedule for the next 48 hours and then as needed.  Apply moist heat to your neck for 30 minutes at a time 2-3 times a day to improve blood flow to the area and help remove the lactic acid causing the spasm.  Follow the neck exercises given at discharge.  Return for reevaluation for any new or worsening symptoms.

## 2024-04-12 NOTE — ED Provider Notes (Signed)
 [facility]    CSN: [id number] Arrival date & time: 04/12/24  1019      History   Chief Complaint Chief Complaint  Patient presents with   Neck Pain   Shoulder Pain   Tingling    HPI Katherine Adkins is a 48 y.o. female.   HPI  48 year old female with past medical history significant for obesity, prediabetes, anxiety, anemia, asthma, and essential hypertension presents for evaluation of left-sided neck and shoulder pain that has been going on for the past month.  She reports that she saw her PCP, Dr. Gala Jubilee, when her symptoms first began and was prescribed ibuprofen  and Flexeril  which improved her symptoms minimally.  She was also getting frequent massages which helped.  Additionally, she stopped wearing bras with shoulder straps which also helped improve the symptoms.  In the last week she has developed burning pain on the back of her left arm and in her left bicep.  She also has numbness and tingling in her 2nd through 4th fingertips.  She denies any weakness in her grip.  No injury.  Past Medical History:  Diagnosis Date   Anemia    Anxiety    Asthma     Patient Active Problem List   Diagnosis Date Noted   Prediabetes 03/23/2024   Rash and nonspecific skin eruption 03/23/2024   Obesity, morbid (HCC) 01/03/2024   Essential hypertension 11/10/2023   Mild intermittent asthma without complication 10/31/2018   Benign neoplasm of descending colon    Cannot sleep 11/05/2015   Low back strain 11/05/2015   Snores 11/05/2015    Past Surgical History:  Procedure Laterality Date   CESAREAN SECTION  2004, 2007   COLONOSCOPY  11/22/2005   Spanarkel - single benign polyp   COLONOSCOPY WITH PROPOFOL  N/A 05/31/2016   Procedure: COLONOSCOPY WITH PROPOFOL ;  Surgeon: Marnee Sink, MD;  Location: [facility];  Service: Endoscopy;  Laterality: N/A;   LAPAROSCOPIC ABLATION OF UTERINE FIBROIDS WITH ACESSA  2024   POLYPECTOMY  05/31/2016   Procedure:  POLYPECTOMY;  Surgeon: Marnee Sink, MD;  Location: [facility];  Service: Endoscopy;;    OB History     Gravida  3   Para  2   Term  2   Preterm      AB  1   Living  2      SAB  1   IAB      Ectopic      Multiple      Live Births               Home Medications    Prior to Admission medications   Medication Sig Start Date End Date Taking? Authorizing Provider  baclofen (LIORESAL) 10 MG tablet Take 1 tablet (10 mg total) by mouth 3 (three) times daily. 04/12/24  Yes Kent Pear, NP  predniSONE  (STERAPRED UNI-PAK 21 TAB) 10 MG (21) TBPK tablet Take 6 tablets on day 1, 5 tablets day 2, 4 tablets day 3, 3 tablets day 4, 2 tablets day 5, 1 tablet day 6 04/12/24  Yes Kent Pear, NP  albuterol  (VENTOLIN  HFA) 108 (90 Base) MCG/ACT inhaler INHALE 2 PUFFS INTO THE LUNGS EVERY 4 HOURS AS NEEDED FOR WHEEZE OR FOR SHORTNESS OF BREATH 03/08/24   Reddick, Johnathan B, NP  amLODipine  (NORVASC ) 5 MG tablet Take 1 tablet (5 mg total) by mouth daily. 01/03/24   Sheron Dixons, MD  azelastine  (ASTELIN ) 0.1 % nasal spray Place 1 spray  into both nostrils 2 (two) times daily. Use in each nostril as directed 03/08/24   Reddick, Johnathan B, NP  cyclobenzaprine  (FLEXERIL ) 10 MG tablet Take 1 tablet (10 mg total) by mouth 3 (three) times daily as needed for muscle spasms. 03/23/24   Berglund, Laura H, MD  Emollient (CETAPHIL) cream 1 Application daily as needed.    [provider]  fexofenadine  (ALLEGRA ) 180 MG tablet Take 1 tablet (180 mg total) by mouth daily. 03/08/24   Reddick, Johnathan B, NP  ibuprofen  (ADVIL ) 800 MG tablet Take 1 tablet (800 mg total) by mouth every 8 (eight) hours as needed. 03/23/24   Sheron Dixons, MD  levonorgestrel (MIRENA) 20 MCG/DAY IUD 1 each by Intrauterine route once.    [provider]  meloxicam  (MOBIC ) 15 MG tablet Take 1 tablet (15 mg total) by mouth daily. 02/09/24   Reina Cara, DPM  montelukast  (SINGULAIR ) 10 MG tablet TAKE 1  TABLET BY MOUTH EVERYDAY AT BEDTIME 06/15/23   Berglund, Laura H, MD  Multiple Vitamins-Minerals (MULTIVITAMIN WITH MINERALS) tablet Take 1 tablet by mouth daily.    [provider]  SYMBICORT  80-4.5 MCG/ACT inhaler INHALE 2 PUFFS INTO THE LUNGS 2 (TWO) TIMES DAILY. IN THE MORNING AND AT BEDTIME. 02/15/23   Sheron Dixons, MD    Family History Family History  Adopted: Yes    Social History Social History   Tobacco Use   Smoking status: Never   Smokeless tobacco: Never  Vaping Use   Vaping status: Never Used  Substance Use Topics   Alcohol use: No    Alcohol/week: 0.0 standard drinks of alcohol    Comment: 1 glass of wine per month   Drug use: No     Allergies   Irbesartan    Review of Systems Review of Systems  Musculoskeletal:  Positive for back pain and neck pain. Negative for neck stiffness.  Neurological:  Positive for numbness. Negative for weakness.     Physical Exam Triage Vital Signs ED Triage Vitals  Encounter Vitals Group     BP      Systolic BP Percentile      Diastolic BP Percentile      Pulse      Resp      Temp      Temp src      SpO2      Weight      Height      Head Circumference      Peak Flow      Pain Score      Pain Loc      Pain Education      Exclude from Growth Chart    No data found.  Updated Vital Signs BP (!) 157/90 (BP Location: Right Arm)   Pulse (!) 102   Temp 98.2 F (36.8 C) (Oral)   Resp 16   SpO2 96%   Visual Acuity Right Eye Distance:   Left Eye Distance:   Bilateral Distance:    Right Eye Near:   Left Eye Near:    Bilateral Near:     Physical Exam Vitals and nursing note reviewed.  Constitutional:      Appearance: Normal appearance. She is not ill-appearing.  HENT:     Head: Normocephalic and atraumatic.  Musculoskeletal:        General: Tenderness present. No signs of injury.  Skin:    General: Skin is warm and dry.     Capillary Refill: Capillary refill takes  less than 2 seconds.   Neurological:     General: No focal deficit present.     Mental Status: She is alert and oriented to person, place, and time.     Sensory: No sensory deficit.     Motor: No weakness.      UC Treatments / Results  Labs (all labs ordered are listed, but only abnormal results are displayed) Labs Reviewed - No data to display  EKG   Radiology No results found.  Procedures Procedures (including critical care time)  Medications Ordered in UC Medications - No data to display  Initial Impression / Assessment and Plan / UC Course  I have reviewed the triage vital signs and the nursing notes.  Pertinent labs & imaging results that were available during my care of the patient were reviewed by me and considered in my medical decision making (see chart for details).   Patient is a pleasant, nontoxic-appearing 48 year old female presenting for evaluation of left-sided neck and back pain as outlined HPI above.  In the exam room she is sitting upright with normal axial carriage but she does have limited rotation of her cervical spine.  She has no bony process tenderness or step-off in her cervical or thoracic spine but she does have significant muscle tension and spasm in the left cervical and thoracic paraspinous region.  No appreciable trigger points noted.  Grips are 5/5 bilaterally and she has 5/5 upper extremity strength bilaterally.  The pain does increase with resisted extension of her left upper arm but no pain with resisted flexion or abduction of the left upper arm.  I suspect that the patient has cervical and thoracic radiculopathy with possible cervical or brachial plexus impingement given the burning sensation in her upper arm and the tingling in her fingers.  Given that NSAIDs have not provided significant relief we will do a trial of prednisone  as well as baclofen.  I will have staff administer 10 mg of IM Decadron here in clinic.  I will also refer the patient to the spine clinic for  further evaluation and possible spinal imaging.   Final Clinical Impressions(s) / UC Diagnoses   Final diagnoses:  Cervical disc disorder with radiculopathy of cervical region  Thoracic radiculopathy     Discharge Instructions      Take the prednisone  according to the package instructions.  Start tomorrow morning at breakfast.  You will take a decreasing dose each morning at breakfast over the next 6 days.  Take the baclofen, 10 mg every 8 hours, on a schedule for the next 48 hours and then as needed.  Apply moist heat to your neck for 30 minutes at a time 2-3 times a day to improve blood flow to the area and help remove the lactic acid causing the spasm.  Follow the neck exercises given at discharge.  Return for reevaluation for any new or worsening symptoms.    ED Prescriptions     Medication Sig Dispense Auth. Provider   baclofen (LIORESAL) 10 MG tablet Take 1 tablet (10 mg total) by mouth 3 (three) times daily. 30 each Kent Pear, NP   predniSONE  (STERAPRED UNI-PAK 21 TAB) 10 MG (21) TBPK tablet Take 6 tablets on day 1, 5 tablets day 2, 4 tablets day 3, 3 tablets day 4, 2 tablets day 5, 1 tablet day 6 21 tablet Kent Pear, NP      PDMP not reviewed this encounter.   Kent Pear, NP 04/12/24 1120

## 2024-05-07 ENCOUNTER — Other Ambulatory Visit: Payer: Self-pay

## 2024-05-07 ENCOUNTER — Encounter: Payer: Self-pay | Admitting: Internal Medicine

## 2024-05-07 DIAGNOSIS — I10 Essential (primary) hypertension: Secondary | ICD-10-CM

## 2024-05-07 MED ORDER — AMLODIPINE BESYLATE 5 MG PO TABS: 5.0000 mg | ORAL_TABLET | Freq: Every day | ORAL | 0 refills | Status: DC

## 2024-05-16 DIAGNOSIS — L738 Other specified follicular disorders: Secondary | ICD-10-CM | POA: Diagnosis not present

## 2024-05-21 ENCOUNTER — Other Ambulatory Visit: Payer: Self-pay | Admitting: Internal Medicine

## 2024-05-21 DIAGNOSIS — J4530 Mild persistent asthma, uncomplicated: Secondary | ICD-10-CM

## 2024-05-22 NOTE — Telephone Encounter (Signed)
 Requested Prescriptions  Pending Prescriptions Disp Refills   montelukast  (SINGULAIR ) 10 MG tablet [Pharmacy Med Name: MONTELUKAST  10MG  TABLETS] 90 tablet 1    Sig: TAKE 1 TABLET BY MOUTH EVERY DAY     Pulmonology:  Leukotriene Inhibitors Passed - 05/22/2024  3:38 PM      Passed - Valid encounter within last 12 months    Recent Outpatient Visits           2 months ago Cervical paraspinal muscle spasm   [place] [facility] at [facility], Leita DEL, MD   4 months ago Essential hypertension   [facility] at [facility], Leita DEL, MD

## 2024-06-30 ENCOUNTER — Encounter: Payer: Self-pay | Admitting: Internal Medicine

## 2024-07-02 ENCOUNTER — Other Ambulatory Visit: Payer: Self-pay

## 2024-07-02 DIAGNOSIS — I10 Essential (primary) hypertension: Secondary | ICD-10-CM

## 2024-07-02 MED ORDER — AMLODIPINE BESYLATE 5 MG PO TABS: 5.0000 mg | ORAL_TABLET | Freq: Every day | ORAL | 0 refills | Status: DC

## 2024-07-03 ENCOUNTER — Ambulatory Visit: Admitting: Podiatry

## 2024-07-24 ENCOUNTER — Other Ambulatory Visit: Payer: Self-pay | Admitting: Internal Medicine

## 2024-07-24 DIAGNOSIS — J4530 Mild persistent asthma, uncomplicated: Secondary | ICD-10-CM

## 2024-07-24 DIAGNOSIS — J4541 Moderate persistent asthma with (acute) exacerbation: Secondary | ICD-10-CM

## 2024-07-24 NOTE — Telephone Encounter (Unsigned)
 Copied from CRM #[id number]. Topic: Clinical - Medication Refill >> Jul 24, 2024 12:23 PM Antwanette L wrote: Medication: montelukast  (SINGULAIR ) 10 MG tablet, SYMBICORT  80-4.5 MCG/ACT inhaler,fexofenadine  (ALLEGRA ) 180 MG tablet  Has the patient contacted their pharmacy? Yes  This is the patient's preferred pharmacy:  [phone] [address], [place] - [address] [place] [address] Phone: [phone] Fax: [phone]    Is this the correct pharmacy for this prescription? Yes. [facility]s.    Has the prescription been filled recently? Yes. Symbicort  was refilled on 02/15/23, fexodenadine (allergra) was refilled on 03/08/24, and motelukast was refilled on 05/22/24  Is the patient out of the medication? Yes  Has the patient been seen for an appointment in the last year OR does the patient have an upcoming appointment? Yes. Last ov w/ Dr. Justus was on 03/23/24  Can we respond through MyChart? No. Contact the patient by phone at [phone]  Agent: Please be advised that Rx refills may take up to 3 business days. We ask that you follow-up with your pharmacy.

## 2024-07-25 MED ORDER — FEXOFENADINE HCL 180 MG PO TABS: 180.0000 mg | ORAL_TABLET | Freq: Every day | ORAL | 1 refills | Status: DC

## 2024-07-25 NOTE — Telephone Encounter (Signed)
 Too soon for refill, LRF 05/22/24 for 90 days.  Requested Prescriptions  Pending Prescriptions Disp Refills   montelukast  (SINGULAIR ) 10 MG tablet 90 tablet 1    Sig: Take 1 tablet (10 mg total) by mouth daily.     Pulmonology:  Leukotriene Inhibitors Passed - 07/25/2024  9:17 AM      Passed - Valid encounter within last 12 months    Recent Outpatient Visits           4 months ago Cervical paraspinal muscle spasm   [place] [facility] at [facility], Leita DEL, MD   6 months ago Essential hypertension   [place] [facility] at [facility], Leita DEL, MD               fexofenadine  (ALLEGRA ) 180 MG tablet 60 tablet 1    Sig: Take 1 tablet (180 mg total) by mouth daily.     Ear, Nose, and Throat:  Antihistamines Passed - 07/25/2024  9:17 AM      Passed - Valid encounter within last 12 months    Recent Outpatient Visits           4 months ago Cervical paraspinal muscle spasm   [place] [facility] at [facility], Leita DEL, MD   6 months ago Essential hypertension   [facility] at [facility], Leita DEL, MD

## 2024-07-25 NOTE — Telephone Encounter (Signed)
 Requested medication (s) are due for refill today: yes  Requested medication (s) are on the active medication list: yes  Last refill:  03/08/24  Future visit scheduled: No  Notes to clinic:  Unable to refill per protocol, last refill by another provider.      Requested Prescriptions  Pending Prescriptions Disp Refills   fexofenadine  (ALLEGRA ) 180 MG tablet 60 tablet 1    Sig: Take 1 tablet (180 mg total) by mouth daily.     Ear, Nose, and Throat:  Antihistamines Passed - 07/25/2024  9:19 AM      Passed - Valid encounter within last 12 months    Recent Outpatient Visits           4 months ago Cervical paraspinal muscle spasm   [place] [facility] at [facility], Leita DEL, MD   6 months ago Essential hypertension   [place] [facility] at [facility], Leita DEL, MD              Refused Prescriptions Disp Refills   montelukast  (SINGULAIR ) 10 MG tablet 90 tablet 1    Sig: Take 1 tablet (10 mg total) by mouth daily.     Pulmonology:  Leukotriene Inhibitors Passed - 07/25/2024  9:19 AM      Passed - Valid encounter within last 12 months    Recent Outpatient Visits           4 months ago Cervical paraspinal muscle spasm   [place] [facility] at [facility], Leita DEL, MD   6 months ago Essential hypertension   [facility] at [facility], Leita DEL, MD

## 2024-07-28 ENCOUNTER — Other Ambulatory Visit: Payer: Self-pay | Admitting: Internal Medicine

## 2024-07-28 DIAGNOSIS — J4530 Mild persistent asthma, uncomplicated: Secondary | ICD-10-CM

## 2024-07-30 ENCOUNTER — Telehealth: Payer: Self-pay | Admitting: Internal Medicine

## 2024-07-30 ENCOUNTER — Other Ambulatory Visit: Payer: Self-pay | Admitting: Internal Medicine

## 2024-07-30 DIAGNOSIS — J4520 Mild intermittent asthma, uncomplicated: Secondary | ICD-10-CM

## 2024-07-30 MED ORDER — MONTELUKAST SODIUM 10 MG PO TABS: 10.0000 mg | ORAL_TABLET | Freq: Every day | ORAL | 1 refills | Status: AC

## 2024-07-30 MED ORDER — BUDESONIDE-FORMOTEROL FUMARATE 80-4.5 MCG/ACT IN AERO: 2.0000 | INHALATION_SPRAY | Freq: Two times a day (BID) | RESPIRATORY_TRACT | 5 refills | Status: AC

## 2024-07-30 NOTE — Telephone Encounter (Signed)
Rx for both medications sent to her pharmacy

## 2024-07-30 NOTE — Telephone Encounter (Signed)
 Copied from CRM [id number]. Topic: Clinical - Medication Question >> Jul 30, 2024 12:27 PM Sophia H wrote: Reason for CRM: Patient requested a refill on montelukast  (SINGULAIR ) 10 MG tablet, SYMBICORT  80-4.5 MCG/ACT inhaler. States only her allegra  was ready for pick up, wanted to know if she was going to be able to get these 2 above refilled? Both last prescribed by PCP. Please advise # [phone]   [id number] [place], [place] - [address]

## 2024-07-30 NOTE — Progress Notes (Unsigned)
 Date:  07/30/2024   Name:  Katherine Adkins   DOB:  10-26-1976   MRN:  [id number]   Chief Complaint: No chief complaint on file.  HPI  Review of Systems   Lab Results  Component Value Date   NA 141 10/18/2023   K 4.0 10/18/2023   CO2 24 10/18/2023   GLUCOSE 101 (H) 10/18/2023   BUN 12 10/18/2023   CREATININE 0.86 10/18/2023   CALCIUM 9.7 10/18/2023   EGFR 83 10/18/2023   GFRNONAA 94 09/14/2019   Lab Results  Component Value Date   CHOL 140 10/18/2023   HDL 52 10/18/2023   LDLCALC 76 10/18/2023   TRIG 57 10/18/2023   CHOLHDL 2.7 10/18/2023   Lab Results  Component Value Date   TSH 1.860 10/18/2023   Lab Results  Component Value Date   HGBA1C 5.5 03/23/2024   Lab Results  Component Value Date   WBC 4.2 10/18/2023   HGB 13.3 10/18/2023   HCT 41.3 10/18/2023   MCV 87 10/18/2023   PLT 307 10/18/2023   Lab Results  Component Value Date   ALT 21 10/18/2023   AST 20 10/18/2023   ALKPHOS 107 10/18/2023   BILITOT 0.4 10/18/2023   No results found for: MARIEN BOLLS, VD25OH   Patient Active Problem List   Diagnosis Date Noted   Prediabetes 03/23/2024   Rash and nonspecific skin eruption 03/23/2024   Obesity, morbid (HCC) 01/03/2024   Essential hypertension 11/10/2023   Mild intermittent asthma without complication 10/31/2018   Benign neoplasm of descending colon    Cannot sleep 11/05/2015   Low back strain 11/05/2015   Snores 11/05/2015    Allergies  Allergen Reactions   Irbesartan  Swelling and Rash    Past Surgical History:  Procedure Laterality Date   CESAREAN SECTION  2004, 2007   COLONOSCOPY  11/22/2005   Spanarkel - single benign polyp   COLONOSCOPY WITH PROPOFOL  N/A 05/31/2016   Procedure: COLONOSCOPY WITH PROPOFOL ;  Surgeon: Rogelia Copping, MD;  Location: [facility];  Service: Endoscopy;  Laterality: N/A;   LAPAROSCOPIC ABLATION OF UTERINE FIBROIDS WITH ACESSA  2024   POLYPECTOMY  05/31/2016   Procedure:  POLYPECTOMY;  Surgeon: Rogelia Copping, MD;  Location: [facility];  Service: Endoscopy;;    Social History   Tobacco Use   Smoking status: Never   Smokeless tobacco: Never  Vaping Use   Vaping status: Never Used  Substance Use Topics   Alcohol use: No    Alcohol/week: 0.0 standard drinks of alcohol    Comment: 1 glass of wine per month   Drug use: No     Medication list has been reviewed and updated.  No outpatient medications have been marked as taking for the 07/30/24 encounter (Orders Only) with [name], MD.       03/23/2024   11:08 AM 01/03/2024    3:01 PM 12/02/2023   11:29 AM 11/10/2023   10:29 AM  GAD 7 : Generalized Anxiety Score  Nervous, Anxious, on Edge 0 1 0 0  Control/stop worrying 0 0 0 0  Worry too much - different things 0 1 0 0  Trouble relaxing 0 0 0 0  Restless 0 0 0 0  Easily annoyed or irritable 0 0 0 0  Afraid - awful might happen 0 0 0 0  Total GAD 7 Score 0 2 0 0  Anxiety Difficulty Not difficult at all Not difficult at all Not difficult at all Not  difficult at all       03/23/2024   11:08 AM 03/23/2024   10:35 AM 01/03/2024    3:01 PM  Depression screen PHQ 2/9  Decreased Interest 0 0 0  Down, Depressed, Hopeless 0 0 0  PHQ - 2 Score 0 0 0  Altered sleeping 0  1  Tired, decreased energy 0  1  Change in appetite 0  0  Feeling bad or failure about yourself  0  0  Trouble concentrating 0  0  Moving slowly or fidgety/restless 0  0  Suicidal thoughts 0  0  PHQ-9 Score 0  2  Difficult doing work/chores Not difficult at all  Not difficult at all    BP Readings from Last 3 Encounters:  04/12/24 (!) 157/90  03/23/24 136/78  03/08/24 122/70    Physical Exam  Wt Readings from Last 3 Encounters:  03/23/24 246 lb (111.6 kg)  01/03/24 249 lb (112.9 kg)  12/02/23 253 lb (114.8 kg)    There were no vitals taken for this visit.  Assessment and Plan:  Problem List Items Addressed This Visit   None   No follow-ups on file.     Leita HILARIO Adie, MD [facility] [place]

## 2024-10-08 DIAGNOSIS — J069 Acute upper respiratory infection, unspecified: Secondary | ICD-10-CM | POA: Diagnosis not present

## 2024-10-08 DIAGNOSIS — J4521 Mild intermittent asthma with (acute) exacerbation: Secondary | ICD-10-CM | POA: Diagnosis not present

## 2024-10-10 ENCOUNTER — Ambulatory Visit: Admitting: Internal Medicine

## 2024-10-10 VITALS — BP 112/78 | HR 98 | Temp 97.9°F | Ht 67.0 in | Wt 257.0 lb

## 2024-10-10 DIAGNOSIS — J4541 Moderate persistent asthma with (acute) exacerbation: Secondary | ICD-10-CM

## 2024-10-10 DIAGNOSIS — I10 Essential (primary) hypertension: Secondary | ICD-10-CM | POA: Diagnosis not present

## 2024-10-10 DIAGNOSIS — J4540 Moderate persistent asthma, uncomplicated: Secondary | ICD-10-CM | POA: Diagnosis not present

## 2024-10-10 MED ORDER — PREDNISONE 10 MG PO TABS: ORAL_TABLET | ORAL | 0 refills | Status: AC

## 2024-10-10 MED ORDER — AMLODIPINE BESYLATE 5 MG PO TABS: 5.0000 mg | ORAL_TABLET | Freq: Every day | ORAL | 1 refills | Status: AC

## 2024-10-10 MED ORDER — ALBUTEROL SULFATE HFA 108 (90 BASE) MCG/ACT IN AERS: INHALATION_SPRAY | RESPIRATORY_TRACT | 2 refills | Status: AC

## 2024-10-10 MED ORDER — AZITHROMYCIN 250 MG PO TABS: ORAL_TABLET | ORAL | 0 refills | Status: AC

## 2024-10-10 MED ORDER — PROMETHAZINE-DM 6.25-15 MG/5ML PO SYRP: 5.0000 mL | ORAL_SOLUTION | Freq: Four times a day (QID) | ORAL | 0 refills | Status: AC | PRN

## 2024-10-10 NOTE — Progress Notes (Signed)
 Date:  10/10/2024   Name:  Katherine Adkins   DOB:  [birth year]   MRN:  [id number]   Chief Complaint: Cough (X 2 days Cough- yellow production, chest congestion. No Sob or fever. Chills, and fatigue. )  Cough This is a new problem. The current episode started in the past 7 days. The problem has been unchanged. The problem occurs every few minutes. The cough is Productive of sputum. Associated symptoms include wheezing. Pertinent negatives include no chills or fever.    Review of Systems  Constitutional:  Negative for chills and fever.  Respiratory:  Positive for cough and wheezing.      Lab Results  Component Value Date   NA 141 10/18/2023   K 4.0 10/18/2023   CO2 24 10/18/2023   GLUCOSE 101 (H) 10/18/2023   BUN 12 10/18/2023   CREATININE 0.86 10/18/2023   CALCIUM 9.7 10/18/2023   EGFR 83 10/18/2023   GFRNONAA 94 09/14/2019   Lab Results  Component Value Date   CHOL 140 10/18/2023   HDL 52 10/18/2023   LDLCALC 76 10/18/2023   TRIG 57 10/18/2023   CHOLHDL 2.7 10/18/2023   Lab Results  Component Value Date   TSH 1.860 10/18/2023   Lab Results  Component Value Date   HGBA1C 5.5 03/23/2024   Lab Results  Component Value Date   WBC 4.2 10/18/2023   HGB 13.3 10/18/2023   HCT 41.3 10/18/2023   MCV 87 10/18/2023   PLT 307 10/18/2023   Lab Results  Component Value Date   ALT 21 10/18/2023   AST 20 10/18/2023   ALKPHOS 107 10/18/2023   BILITOT 0.4 10/18/2023   No results found for: MARIEN BOLLS, VD25OH   Patient Active Problem List   Diagnosis Date Noted   Moderate persistent asthma without complication 10/10/2024   Prediabetes 03/23/2024   Rash and nonspecific skin eruption 03/23/2024   Obesity, morbid (HCC) 01/03/2024   Essential hypertension 11/10/2023   Benign neoplasm of descending colon    Cannot sleep 11/05/2015   Low back strain 11/05/2015   Snores 11/05/2015    Allergies  Allergen Reactions   Irbesartan  Swelling and Rash     Past Surgical History:  Procedure Laterality Date   CESAREAN SECTION  2004, 2007   COLONOSCOPY  11/22/2005   [place] - single benign polyp   COLONOSCOPY WITH PROPOFOL  N/A 05/31/2016   Procedure: COLONOSCOPY WITH PROPOFOL ;  Surgeon: Rogelia Copping, MD;  Location: [facility];  Service: Endoscopy;  Laterality: N/A;   LAPAROSCOPIC ABLATION OF UTERINE FIBROIDS WITH ACESSA  2024   POLYPECTOMY  05/31/2016   Procedure: POLYPECTOMY;  Surgeon: Rogelia Copping, MD;  Location: [facility];  Service: Endoscopy;;    Social History   Tobacco Use   Smoking status: Never   Smokeless tobacco: Never  Vaping Use   Vaping status: Never Used  Substance Use Topics   Alcohol use: No    Alcohol/week: 0.0 standard drinks of alcohol    Comment: 1 glass of wine per month   Drug use: No     Medication list has been reviewed and updated.  Current Meds  Medication Sig   azelastine  (ASTELIN ) 0.1 % nasal spray Place 1 spray into both nostrils 2 (two) times daily. Use in each nostril as directed   azithromycin  (ZITHROMAX  Z-PAK) 250 MG tablet UAD   baclofen  (LIORESAL ) 10 MG tablet Take 1 tablet (10 mg total) by mouth 3 (three) times daily.   budesonide -formoterol  (SYMBICORT ) 80-4.5  MCG/ACT inhaler Inhale 2 puffs into the lungs 2 (two) times daily.   cyclobenzaprine  (FLEXERIL ) 10 MG tablet Take 1 tablet (10 mg total) by mouth 3 (three) times daily as needed for muscle spasms.   Emollient (CETAPHIL) cream 1 Application daily as needed.   fexofenadine  (ALLEGRA ) 180 MG tablet Take 1 tablet (180 mg total) by mouth daily.   ibuprofen  (ADVIL ) 800 MG tablet Take 1 tablet (800 mg total) by mouth every 8 (eight) hours as needed.   levonorgestrel (MIRENA) 20 MCG/DAY IUD 1 each by Intrauterine route once.   meloxicam  (MOBIC ) 15 MG tablet Take 1 tablet (15 mg total) by mouth daily.   montelukast  (SINGULAIR ) 10 MG tablet Take 1 tablet (10 mg total) by mouth daily.   Multiple Vitamins-Minerals  (MULTIVITAMIN WITH MINERALS) tablet Take 1 tablet by mouth daily.   predniSONE  (DELTASONE ) 10 MG tablet Take 6 tablets (60 mg total) by mouth daily with breakfast for 1 day, THEN 5 tablets (50 mg total) daily with breakfast for 1 day, THEN 4 tablets (40 mg total) daily with breakfast for 1 day, THEN 3 tablets (30 mg total) daily with breakfast for 1 day, THEN 2 tablets (20 mg total) daily with breakfast for 1 day, THEN 1 tablet (10 mg total) daily with breakfast for 1 day.   promethazine -dextromethorphan (PROMETHAZINE -DM) 6.25-15 MG/5ML syrup Take 5 mLs by mouth 4 (four) times daily as needed for up to 9 days for cough.   [DISCONTINUED] albuterol  (VENTOLIN  HFA) 108 (90 Base) MCG/ACT inhaler INHALE 2 PUFFS INTO THE LUNGS EVERY 4 HOURS AS NEEDED FOR WHEEZE OR FOR SHORTNESS OF BREATH   [DISCONTINUED] amLODipine  (NORVASC ) 5 MG tablet Take 1 tablet (5 mg total) by mouth daily.   [DISCONTINUED] predniSONE  (DELTASONE ) 20 MG tablet Take 20 mg by mouth 2 (two) times daily.   [DISCONTINUED] predniSONE  (STERAPRED UNI-PAK 21 TAB) 10 MG (21) TBPK tablet Take 6 tablets on day 1, 5 tablets day 2, 4 tablets day 3, 3 tablets day 4, 2 tablets day 5, 1 tablet day 6       10/10/2024   11:07 AM 03/23/2024   11:08 AM 01/03/2024    3:01 PM 12/02/2023   11:29 AM  GAD 7 : Generalized Anxiety Score  Nervous, Anxious, on Edge 0 0 1 0  Control/stop worrying 0 0 0 0  Worry too much - different things 0 0 1 0  Trouble relaxing 0 0 0 0  Restless 0 0 0 0  Easily annoyed or irritable 0 0 0 0  Afraid - awful might happen 0 0 0 0  Total GAD 7 Score 0 0 2 0  Anxiety Difficulty Not difficult at all Not difficult at all Not difficult at all Not difficult at all       10/10/2024   11:07 AM 03/23/2024   11:08 AM 03/23/2024   10:35 AM  Depression screen PHQ 2/9  Decreased Interest 0 0 0  Down, Depressed, Hopeless 0 0 0  PHQ - 2 Score 0 0 0  Altered sleeping 0 0   Tired, decreased energy 0 0   Change in appetite 0 0   Feeling  bad or failure about yourself  0 0   Trouble concentrating 0 0   Moving slowly or fidgety/restless 0 0   Suicidal thoughts 0 0   PHQ-9 Score 0 0    Difficult doing work/chores Not difficult at all Not difficult at all      Data saved with a previous  flowsheet row definition    BP Readings from Last 3 Encounters:  10/10/24 112/78  04/12/24 (!) 157/90  03/23/24 136/78    Physical Exam Vitals and nursing note reviewed.  Constitutional:      General: She is not in acute distress.    Appearance: She is well-developed. She is ill-appearing.  HENT:     Head: Normocephalic and atraumatic.  Cardiovascular:     Rate and Rhythm: Normal rate and regular rhythm.  Pulmonary:     Effort: Pulmonary effort is normal. No respiratory distress.     Breath sounds: Decreased air movement present. No stridor or transmitted upper airway sounds. No wheezing or rhonchi.  Musculoskeletal:     Cervical back: Normal range of motion.  Lymphadenopathy:     Cervical: No cervical adenopathy.  Skin:    General: Skin is warm and dry.     Findings: No rash.  Neurological:     Mental Status: She is alert and oriented to person, place, and time.  Psychiatric:        Mood and Affect: Mood normal.        Behavior: Behavior normal.     Wt Readings from Last 3 Encounters:  10/10/24 257 lb (116.6 kg)  03/23/24 246 lb (111.6 kg)  01/03/24 249 lb (112.9 kg)    BP 112/78   Pulse 98   Temp 97.9 F (36.6 C) (Oral)   Ht 5' 7 (1.702 m)   Wt 257 lb (116.6 kg)   SpO2 99%   BMI 40.25 kg/m   Assessment and Plan:  Problem List Items Addressed This Visit       Unprioritized   Essential hypertension (Chronic)   Relevant Medications   amLODipine  (NORVASC ) 5 MG tablet   Moderate persistent asthma without complication - Primary   Relevant Medications   albuterol  (VENTOLIN  HFA) 108 (90 Base) MCG/ACT inhaler   predniSONE  (DELTASONE ) 10 MG tablet   Other Visit Diagnoses       Moderate persistent asthma  with acute exacerbation in adult       Relevant Medications   albuterol  (VENTOLIN  HFA) 108 (90 Base) MCG/ACT inhaler   azithromycin (ZITHROMAX Z-PAK) 250 MG tablet   predniSONE  (DELTASONE ) 10 MG tablet   promethazine -dextromethorphan (PROMETHAZINE -DM) 6.25-15 MG/5ML syrup       No follow-ups on file.    Leita HILARIO Adie, MD [facility] [place]

## 2024-10-24 ENCOUNTER — Encounter: Admitting: Internal Medicine

## 2024-10-31 ENCOUNTER — Ambulatory Visit (INDEPENDENT_AMBULATORY_CARE_PROVIDER_SITE_OTHER): Admitting: Internal Medicine

## 2024-10-31 ENCOUNTER — Encounter: Payer: Self-pay | Admitting: Internal Medicine

## 2024-10-31 VITALS — BP 124/72 | HR 88 | Ht 67.0 in | Wt 255.0 lb

## 2024-10-31 DIAGNOSIS — Z0000 Encounter for general adult medical examination without abnormal findings: Secondary | ICD-10-CM

## 2024-10-31 DIAGNOSIS — Z1231 Encounter for screening mammogram for malignant neoplasm of breast: Secondary | ICD-10-CM | POA: Diagnosis not present

## 2024-10-31 DIAGNOSIS — J4540 Moderate persistent asthma, uncomplicated: Secondary | ICD-10-CM | POA: Diagnosis not present

## 2024-10-31 DIAGNOSIS — I10 Essential (primary) hypertension: Secondary | ICD-10-CM | POA: Diagnosis not present

## 2024-10-31 DIAGNOSIS — Z13220 Encounter for screening for lipoid disorders: Secondary | ICD-10-CM | POA: Diagnosis not present

## 2024-10-31 DIAGNOSIS — Z23 Encounter for immunization: Secondary | ICD-10-CM

## 2024-10-31 DIAGNOSIS — R7303 Prediabetes: Secondary | ICD-10-CM

## 2024-10-31 MED ORDER — FEXOFENADINE HCL 180 MG PO TABS: 180.0000 mg | ORAL_TABLET | Freq: Every day | ORAL | 1 refills | Status: AC

## 2024-10-31 NOTE — Assessment & Plan Note (Addendum)
 Stable respiratory symptoms - improved since recent URI. Continue Symbicort  daily and prn albuterol  MDI. Will administer Flu and Prevnar vaccines today

## 2024-10-31 NOTE — Patient Instructions (Addendum)
 Call [facility] to schedule your mammogram at [phone].  Below you will find the link to the [facility] community research project I mentioned through [employer]. This is a free genetic screening opportunity specifically looking for three conditions: 1. Hereditary breast and ovarian cancer syndrome 2. Lynch syndrome (increased risks for colorectal, endometrial and other cancers) 3. Familial hypercholesterolemia (very high cholesterol) We focus on these three conditions because they can occur in the general population, and if you discover you have one of these conditions, there are specific actions you can take to reduce your risk. If you'd like to learn more, I recommend visiting this link: [email or web address]

## 2024-10-31 NOTE — Assessment & Plan Note (Signed)
 Well controlled blood pressure today. Current regimen is amlodipine . No medication side effects noted.

## 2024-10-31 NOTE — Progress Notes (Addendum)
 Date:  10/31/2024   Name:  Katherine Adkins   DOB:  1976-02-05   MRN:  [id number]   Chief Complaint: Annual Exam Katherine Adkins is a 48 y.o. female who presents today for her Complete Annual Exam. She feels well. She reports exercising- none. She reports she is sleeping poorly. Breast complaints - none. Pap smear and colonoscopy are up to date.  Health Maintenance  Topic Date Due   HIV Screening  Never done   Hepatitis B Vaccine (1 of 3 - 19+ 3-dose series) Never done   DTaP/Tdap/Td vaccine (2 - Td or Tdap) 05/16/2017   COVID-19 Vaccine (4 - 2025-26 season) 07/23/2024   Breast Cancer Screening  10/25/2024   Colon Cancer Screening  02/19/2027   Pap with HPV screening  11/13/2027   Pneumococcal Vaccine  Completed   Flu Shot  Completed   Hepatitis C Screening  Completed   HPV Vaccine  Aged Out   Meningitis B Vaccine  Aged Out    Hypertension This is a chronic problem. The problem is controlled. Pertinent negatives include no chest pain, headaches, palpitations or shortness of breath. Past treatments include calcium channel blockers. The current treatment provides significant improvement.  Asthma She complains of chest tightness and wheezing. There is no cough or shortness of breath. This is a recurrent problem. The problem occurs intermittently. Pertinent negatives include no chest pain, headaches, myalgias or trouble swallowing. Her symptoms are alleviated by beta-agonist and steroid inhaler. She reports significant improvement on treatment. Her past medical history is significant for asthma.    Review of Systems  Constitutional:  Negative for fatigue and unexpected weight change.  HENT:  Negative for trouble swallowing.   Eyes:  Negative for visual disturbance.  Respiratory:  Positive for wheezing. Negative for cough, chest tightness and shortness of breath.   Cardiovascular:  Negative for chest pain, palpitations and leg swelling.  Gastrointestinal:  Negative for  abdominal pain, constipation and diarrhea.  Musculoskeletal:  Negative for arthralgias and myalgias.  Neurological:  Negative for dizziness, weakness, light-headedness and headaches.     Lab Results  Component Value Date   NA 141 10/18/2023   K 4.0 10/18/2023   CO2 24 10/18/2023   GLUCOSE 101 (H) 10/18/2023   BUN 12 10/18/2023   CREATININE 0.86 10/18/2023   CALCIUM 9.7 10/18/2023   EGFR 83 10/18/2023   GFRNONAA 94 09/14/2019   Lab Results  Component Value Date   CHOL 140 10/18/2023   HDL 52 10/18/2023   LDLCALC 76 10/18/2023   TRIG 57 10/18/2023   CHOLHDL 2.7 10/18/2023   Lab Results  Component Value Date   TSH 1.860 10/18/2023   Lab Results  Component Value Date   HGBA1C 5.5 03/23/2024   Lab Results  Component Value Date   WBC 4.2 10/18/2023   HGB 13.3 10/18/2023   HCT 41.3 10/18/2023   MCV 87 10/18/2023   PLT 307 10/18/2023   Lab Results  Component Value Date   ALT 21 10/18/2023   AST 20 10/18/2023   ALKPHOS 107 10/18/2023   BILITOT 0.4 10/18/2023   No results found for: MARIEN BOLLS, VD25OH   Patient Active Problem List   Diagnosis Date Noted   Moderate persistent asthma without complication 10/10/2024   Prediabetes 03/23/2024   Rash and nonspecific skin eruption 03/23/2024   Obesity, morbid (HCC) 01/03/2024   Essential hypertension 11/10/2023   Benign neoplasm of descending colon    Cannot sleep 11/05/2015   Low back  strain 11/05/2015   Snores 11/05/2015    Allergies  Allergen Reactions   Irbesartan  Swelling and Rash    Past Surgical History:  Procedure Laterality Date   CESAREAN SECTION  2004, 2007   COLONOSCOPY  11/22/2005   [place] - single benign polyp   COLONOSCOPY WITH PROPOFOL  N/A 05/31/2016   Procedure: COLONOSCOPY WITH PROPOFOL ;  Surgeon: Rogelia Copping, MD;  Location: [facility];  Service: Endoscopy;  Laterality: N/A;   LAPAROSCOPIC ABLATION OF UTERINE FIBROIDS WITH ACESSA  2024   POLYPECTOMY  05/31/2016    Procedure: POLYPECTOMY;  Surgeon: Rogelia Copping, MD;  Location: [facility];  Service: Endoscopy;;    Social History   Tobacco Use   Smoking status: Never   Smokeless tobacco: Never  Vaping Use   Vaping status: Never Used  Substance Use Topics   Alcohol use: No    Alcohol/week: 0.0 standard drinks of alcohol    Comment: 1 glass of wine per month   Drug use: No     Medication list has been reviewed and updated.  Current Meds  Medication Sig   albuterol  (VENTOLIN  HFA) 108 (90 Base) MCG/ACT inhaler INHALE 2 PUFFS INTO THE LUNGS EVERY 4 HOURS AS NEEDED FOR WHEEZE OR FOR SHORTNESS OF BREATH   amLODipine  (NORVASC ) 5 MG tablet Take 1 tablet (5 mg total) by mouth daily.   azelastine  (ASTELIN ) 0.1 % nasal spray Place 1 spray into both nostrils 2 (two) times daily. Use in each nostril as directed   baclofen  (LIORESAL ) 10 MG tablet Take 1 tablet (10 mg total) by mouth 3 (three) times daily.   budesonide -formoterol  (SYMBICORT ) 80-4.5 MCG/ACT inhaler Inhale 2 puffs into the lungs 2 (two) times daily.   cyclobenzaprine  (FLEXERIL ) 10 MG tablet Take 1 tablet (10 mg total) by mouth 3 (three) times daily as needed for muscle spasms.   Emollient (CETAPHIL) cream 1 Application daily as needed.   ibuprofen  (ADVIL ) 800 MG tablet Take 1 tablet (800 mg total) by mouth every 8 (eight) hours as needed.   levonorgestrel (MIRENA) 20 MCG/DAY IUD 1 each by Intrauterine route once.   meloxicam  (MOBIC ) 15 MG tablet Take 1 tablet (15 mg total) by mouth daily.   montelukast  (SINGULAIR ) 10 MG tablet Take 1 tablet (10 mg total) by mouth daily.   Multiple Vitamins-Minerals (MULTIVITAMIN WITH MINERALS) tablet Take 1 tablet by mouth daily.   [DISCONTINUED] fexofenadine  (ALLEGRA ) 180 MG tablet Take 1 tablet (180 mg total) by mouth daily.       10/31/2024    9:49 AM 10/10/2024   11:07 AM 03/23/2024   11:08 AM 01/03/2024    3:01 PM  GAD 7 : Generalized Anxiety Score  Nervous, Anxious, on Edge 0 0 0 1   Control/stop worrying 0 0 0 0  Worry too much - different things 0 0 0 1  Trouble relaxing 0 0 0 0  Restless 0 0 0 0  Easily annoyed or irritable 0 0 0 0  Afraid - awful might happen 0 0 0 0  Total GAD 7 Score 0 0 0 2  Anxiety Difficulty Not difficult at all Not difficult at all Not difficult at all Not difficult at all       10/31/2024    9:49 AM 10/10/2024   11:07 AM 03/23/2024   11:08 AM  Depression screen PHQ 2/9  Decreased Interest 0 0 0  Down, Depressed, Hopeless 0 0 0  PHQ - 2 Score 0 0 0  Altered sleeping 2 0  0  Tired, decreased energy 2 0 0  Change in appetite 0 0 0  Feeling bad or failure about yourself  0 0 0  Trouble concentrating 0 0 0  Moving slowly or fidgety/restless 0 0 0  Suicidal thoughts 0 0 0  PHQ-9 Score 4 0 0   Difficult doing work/chores Not difficult at all Not difficult at all Not difficult at all     Data saved with a previous flowsheet row definition    BP Readings from Last 3 Encounters:  10/31/24 124/72  10/10/24 112/78  04/12/24 (!) 157/90    Physical Exam Vitals and nursing note reviewed.  Constitutional:      General: She is not in acute distress.    Appearance: She is well-developed.  HENT:     Head: Normocephalic and atraumatic.     Right Ear: Tympanic membrane and ear canal normal.     Left Ear: Tympanic membrane and ear canal normal.     Nose:     Right Sinus: No maxillary sinus tenderness.     Left Sinus: No maxillary sinus tenderness.  Eyes:     General: No scleral icterus.       Right eye: No discharge.        Left eye: No discharge.     Conjunctiva/sclera: Conjunctivae normal.  Neck:     Thyroid: No thyromegaly.     Vascular: No carotid bruit.  Cardiovascular:     Rate and Rhythm: Normal rate and regular rhythm.     Pulses: Normal pulses.     Heart sounds: Normal heart sounds.  Pulmonary:     Effort: Pulmonary effort is normal. No respiratory distress.     Breath sounds: No wheezing.  Abdominal:     General:  Bowel sounds are normal.     Palpations: Abdomen is soft.     Tenderness: There is no abdominal tenderness.  Musculoskeletal:     Cervical back: Normal range of motion. No erythema.     Right lower leg: No edema.     Left lower leg: No edema.  Lymphadenopathy:     Cervical: No cervical adenopathy.  Skin:    General: Skin is warm and dry.     Findings: No rash.  Neurological:     Mental Status: She is alert and oriented to person, place, and time.     Cranial Nerves: No cranial nerve deficit.     Sensory: No sensory deficit.     Deep Tendon Reflexes: Reflexes are normal and symmetric.  Psychiatric:        Attention and Perception: Attention normal.        Mood and Affect: Mood normal.     Wt Readings from Last 3 Encounters:  10/31/24 255 lb (115.7 kg)  10/10/24 257 lb (116.6 kg)  03/23/24 246 lb (111.6 kg)    BP 124/72   Pulse 88   Ht 5' 7 (1.702 m)   Wt 255 lb (115.7 kg)   SpO2 98%   BMI 39.94 kg/m   Assessment and Plan:  Problem List Items Addressed This Visit       Unprioritized   Essential hypertension (Chronic)   Well controlled blood pressure today. Current regimen is amlodipine . No medication side effects noted.        Relevant Orders   CBC with Differential/Platelet   Comprehensive metabolic panel with GFR   TSH   Urinalysis, Routine w reflex microscopic   Prediabetes   Working on diet  and exercise. Lab Results  Component Value Date   HGBA1C 5.5 03/23/2024         Relevant Orders   Hemoglobin A1c   Moderate persistent asthma without complication   Stable respiratory symptoms - improved since recent URI. Continue Symbicort  daily and prn albuterol  MDI. Will administer Flu and Prevnar vaccines today      Relevant Medications   fexofenadine  (ALLEGRA ) 180 MG tablet   Other Relevant Orders   CBC with Differential/Platelet   Other Visit Diagnoses       Annual physical exam    -  Primary   continue exercise and healthy diet. Flu and  Prevnar today Tdap if she sustains an injury   Relevant Orders   CBC with Differential/Platelet   Comprehensive metabolic panel with GFR   Hemoglobin A1c   Lipid panel   TSH   Urinalysis, Routine w reflex microscopic     Encounter for screening mammogram for breast cancer       last done at the [facility] she would like to change to [place] here in [employer]   Relevant Orders   MM 3D SCREENING MAMMOGRAM BILATERAL BREAST     Screening for lipid disorders       Relevant Orders   Lipid panel     Encounter for immunization       Relevant Orders   Flu vaccine trivalent PF, 6mos and older(Flulaval,Afluria,Fluarix,Fluzone) (Completed)   Pneumococcal conjugate vaccine 20-valent (Completed)       Return in about 6 months (around 05/01/2025) for HTN.    Leita HILARIO Adie, MD [facility] [place]

## 2024-10-31 NOTE — Assessment & Plan Note (Signed)
 Working on diet and exercise. Lab Results  Component Value Date   HGBA1C 5.5 03/23/2024

## 2024-11-01 ENCOUNTER — Ambulatory Visit: Payer: Self-pay | Admitting: Internal Medicine

## 2024-11-01 LAB — URINALYSIS, ROUTINE W REFLEX MICROSCOPIC
Bilirubin, UA: NEGATIVE
Glucose, UA: NEGATIVE
Ketones, UA: NEGATIVE
Leukocytes,UA: NEGATIVE
Nitrite, UA: NEGATIVE
Protein,UA: NEGATIVE
RBC, UA: NEGATIVE
Specific Gravity, UA: 1.013 (ref 1.005–1.030)
Urobilinogen, Ur: 0.2 mg/dL (ref 0.2–1.0)
pH, UA: 7 (ref 5.0–7.5)

## 2024-11-01 LAB — TSH: TSH: 2.76 u[IU]/mL (ref 0.450–4.500)

## 2024-11-01 LAB — CBC WITH DIFFERENTIAL/PLATELET
Basophils Absolute: 0 x10E3/uL (ref 0.0–0.2)
Basos: 1 %
EOS (ABSOLUTE): 0.2 x10E3/uL (ref 0.0–0.4)
Eos: 4 %
Hematocrit: 41.9 % (ref 34.0–46.6)
Hemoglobin: 13.2 g/dL (ref 11.1–15.9)
Immature Grans (Abs): 0 x10E3/uL (ref 0.0–0.1)
Immature Granulocytes: 0 %
Lymphocytes Absolute: 1.6 x10E3/uL (ref 0.7–3.1)
Lymphs: 36 %
MCH: 28.7 pg (ref 26.6–33.0)
MCHC: 31.5 g/dL (ref 31.5–35.7)
MCV: 91 fL (ref 79–97)
Monocytes Absolute: 0.7 x10E3/uL (ref 0.1–0.9)
Monocytes: 15 %
Neutrophils Absolute: 1.9 x10E3/uL (ref 1.4–7.0)
Neutrophils: 43 %
Platelets: 261 x10E3/uL (ref 150–450)
RBC: 4.6 x10E6/uL (ref 3.77–5.28)
RDW: 12.3 % (ref 11.7–15.4)
WBC: 4.4 x10E3/uL (ref 3.4–10.8)

## 2024-11-01 LAB — HEMOGLOBIN A1C
Est. average glucose Bld gHb Est-mCnc: 128 mg/dL
Hgb A1c MFr Bld: 6.1 % — ABNORMAL HIGH (ref 4.8–5.6)

## 2024-11-01 LAB — LIPID PANEL
Chol/HDL Ratio: 2 ratio (ref 0.0–4.4)
Cholesterol, Total: 141 mg/dL (ref 100–199)
HDL: 69 mg/dL (ref >39)
LDL Chol Calc (NIH): 60 mg/dL (ref 0–99)
Triglycerides: 54 mg/dL (ref 0–149)
VLDL Cholesterol Cal: 12 mg/dL (ref 5–40)

## 2024-11-01 LAB — COMPREHENSIVE METABOLIC PANEL WITH GFR
ALT: 21 IU/L (ref 0–32)
AST: 17 IU/L (ref 0–40)
Albumin: 4.1 g/dL (ref 3.9–4.9)
Alkaline Phosphatase: 99 IU/L (ref 41–116)
BUN/Creatinine Ratio: 21 (ref 9–23)
BUN: 16 mg/dL (ref 6–24)
Bilirubin Total: 0.5 mg/dL (ref 0.0–1.2)
CO2: 22 mmol/L (ref 20–29)
Calcium: 9.1 mg/dL (ref 8.7–10.2)
Chloride: 106 mmol/L (ref 96–106)
Creatinine, Ser: 0.77 mg/dL (ref 0.57–1.00)
Globulin, Total: 2.9 g/dL (ref 1.5–4.5)
Glucose: 124 mg/dL — ABNORMAL HIGH (ref 70–99)
Potassium: 4.4 mmol/L (ref 3.5–5.2)
Sodium: 142 mmol/L (ref 134–144)
Total Protein: 7 g/dL (ref 6.0–8.5)
eGFR: 95 mL/min/1.73 (ref >59)

## 2024-11-29 ENCOUNTER — Telehealth: Payer: Self-pay | Admitting: Internal Medicine

## 2024-11-29 NOTE — Telephone Encounter (Signed)
 Copied from CRM [id number]. Topic: Referral - Request for Referral >> Nov 29, 2024  4:24 PM Katherine Adkins wrote: The patient is calling because she is trying to schedule a mammogram downstairs. Since her previous provider, Katherine Adkins, is no longer with the practice, the imaging department needs a new order from a current provider. The patient saw Katherine Adkins on 10/31/24 and she has an upcoming appointment on 6/11 with Katherine Adkins.

## 2024-11-30 ENCOUNTER — Other Ambulatory Visit: Payer: Self-pay

## 2024-11-30 DIAGNOSIS — Z1231 Encounter for screening mammogram for malignant neoplasm of breast: Secondary | ICD-10-CM

## 2024-11-30 NOTE — Telephone Encounter (Signed)
 Order placed on 11/30/2024.

## 2024-12-04 ENCOUNTER — Ambulatory Visit
Admission: RE | Admit: 2024-12-04 | Discharge: 2024-12-04 | Disposition: A | Discharge status: Home / Self Care | Source: Ambulatory Visit | Attending: Student | Admitting: Student

## 2024-12-04 DIAGNOSIS — Z1231 Encounter for screening mammogram for malignant neoplasm of breast: Secondary | ICD-10-CM | POA: Insufficient documentation

## 2025-05-02 ENCOUNTER — Ambulatory Visit: Admitting: Student
